# Patient Record
Sex: Male | Born: 1995 | Race: Black or African American | Hispanic: No | Marital: Single | Smoking: Never smoker
Health system: Southern US, Community
[De-identification: ages and names within clinical notes are randomized; demographics above are authoritative.]

## PROBLEM LIST (undated history)

## (undated) DIAGNOSIS — I10 Essential (primary) hypertension: Secondary | ICD-10-CM

## (undated) DIAGNOSIS — E119 Type 2 diabetes mellitus without complications: Secondary | ICD-10-CM

## (undated) HISTORY — DX: Type 2 diabetes mellitus without complications: E11.9

## (undated) HISTORY — DX: Essential (primary) hypertension: I10

---
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
## 2017-06-17 IMAGING — CR DG LUMBAR SPINE COMPLETE 4+V
5 series · 5 of 5 positions shown · non-contrast
Comparison: None.

CLINICAL DATA: Low back pain after weight lifting.

EXAM:
LUMBAR SPINE - COMPLETE 4+ VIEW

[l-spine ap]
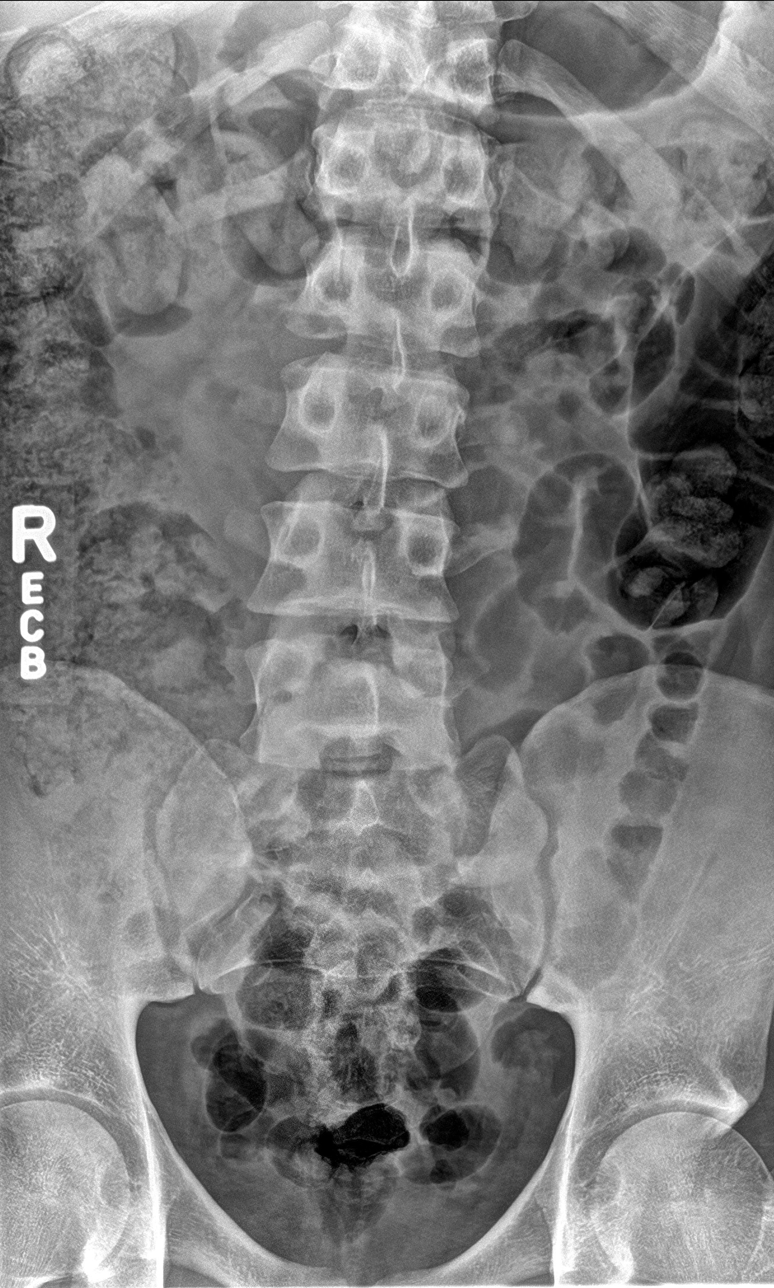

[l-spine obl (1 of 2)]
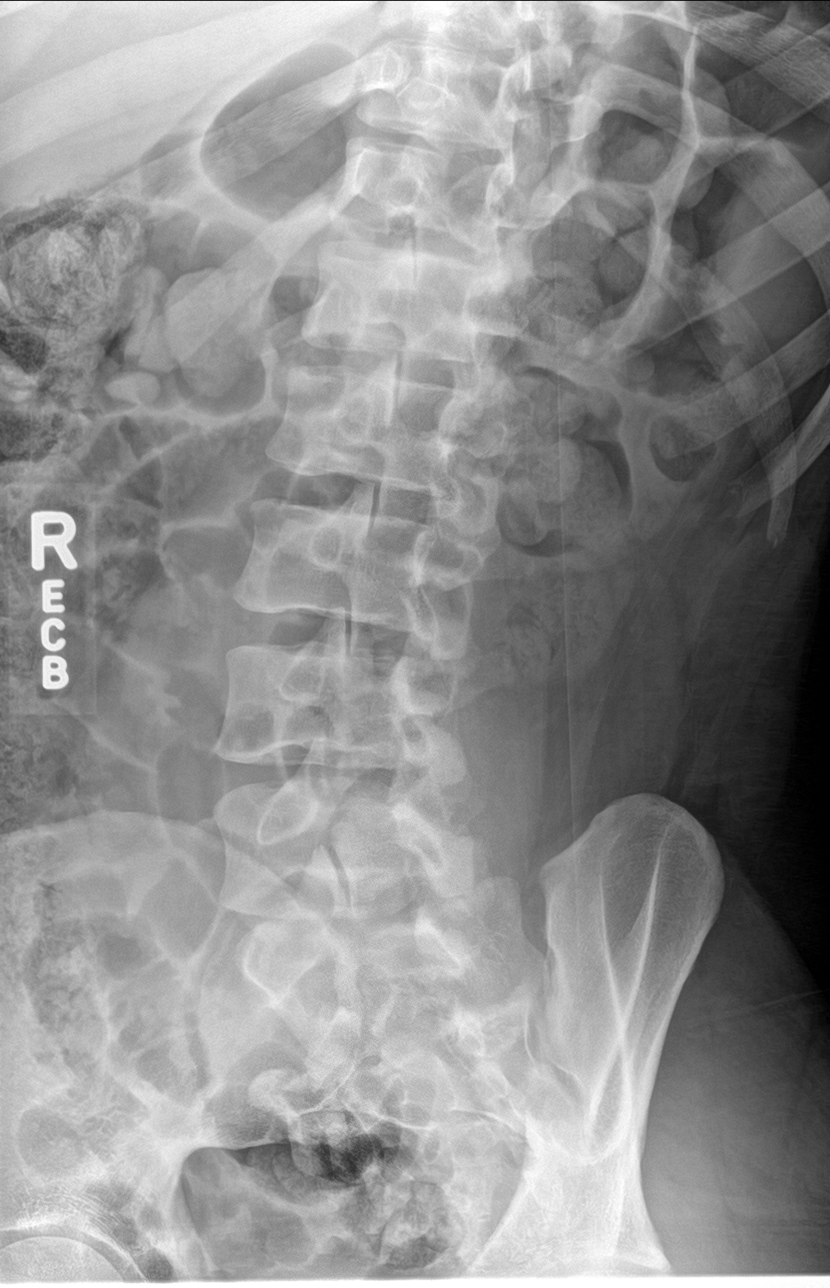

[l-spine obl (2 of 2)]
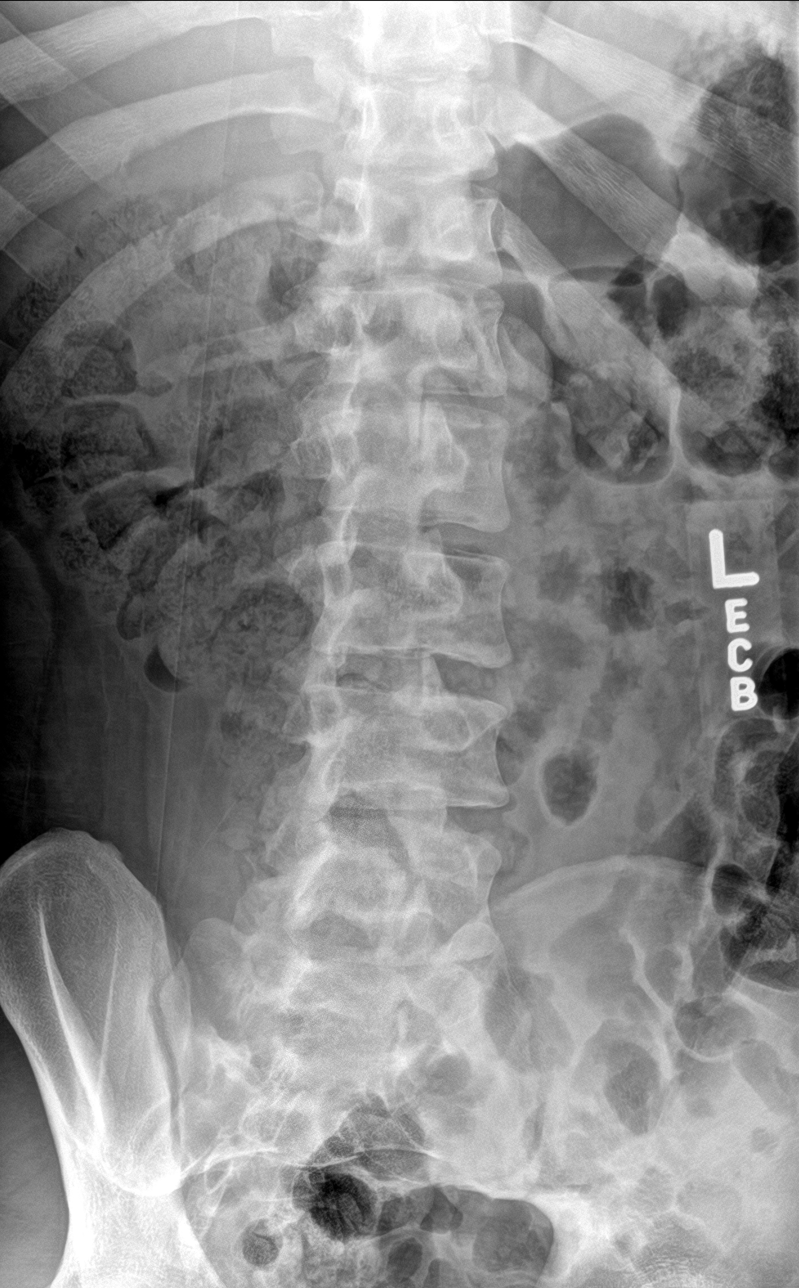

[l-spine lat]
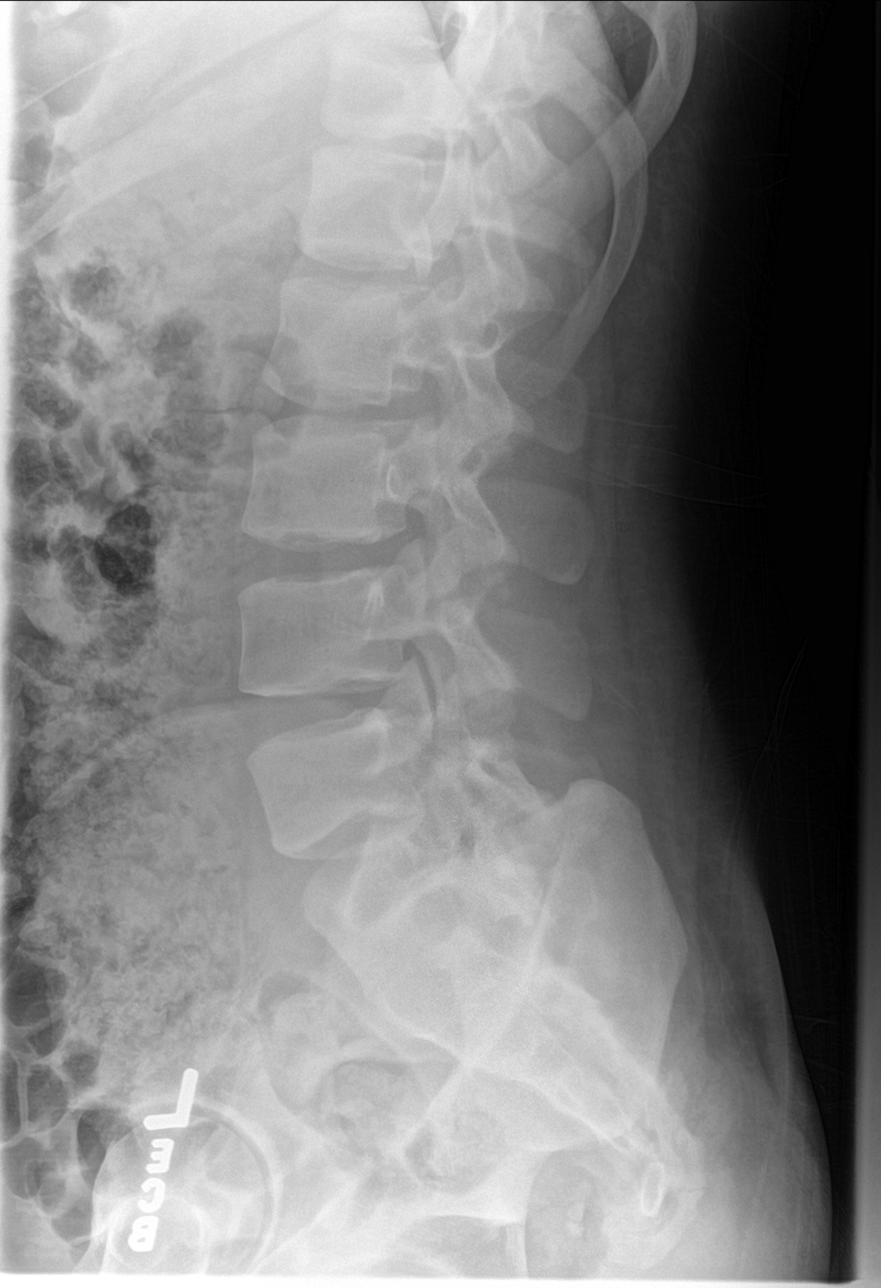

[l-spine spot]
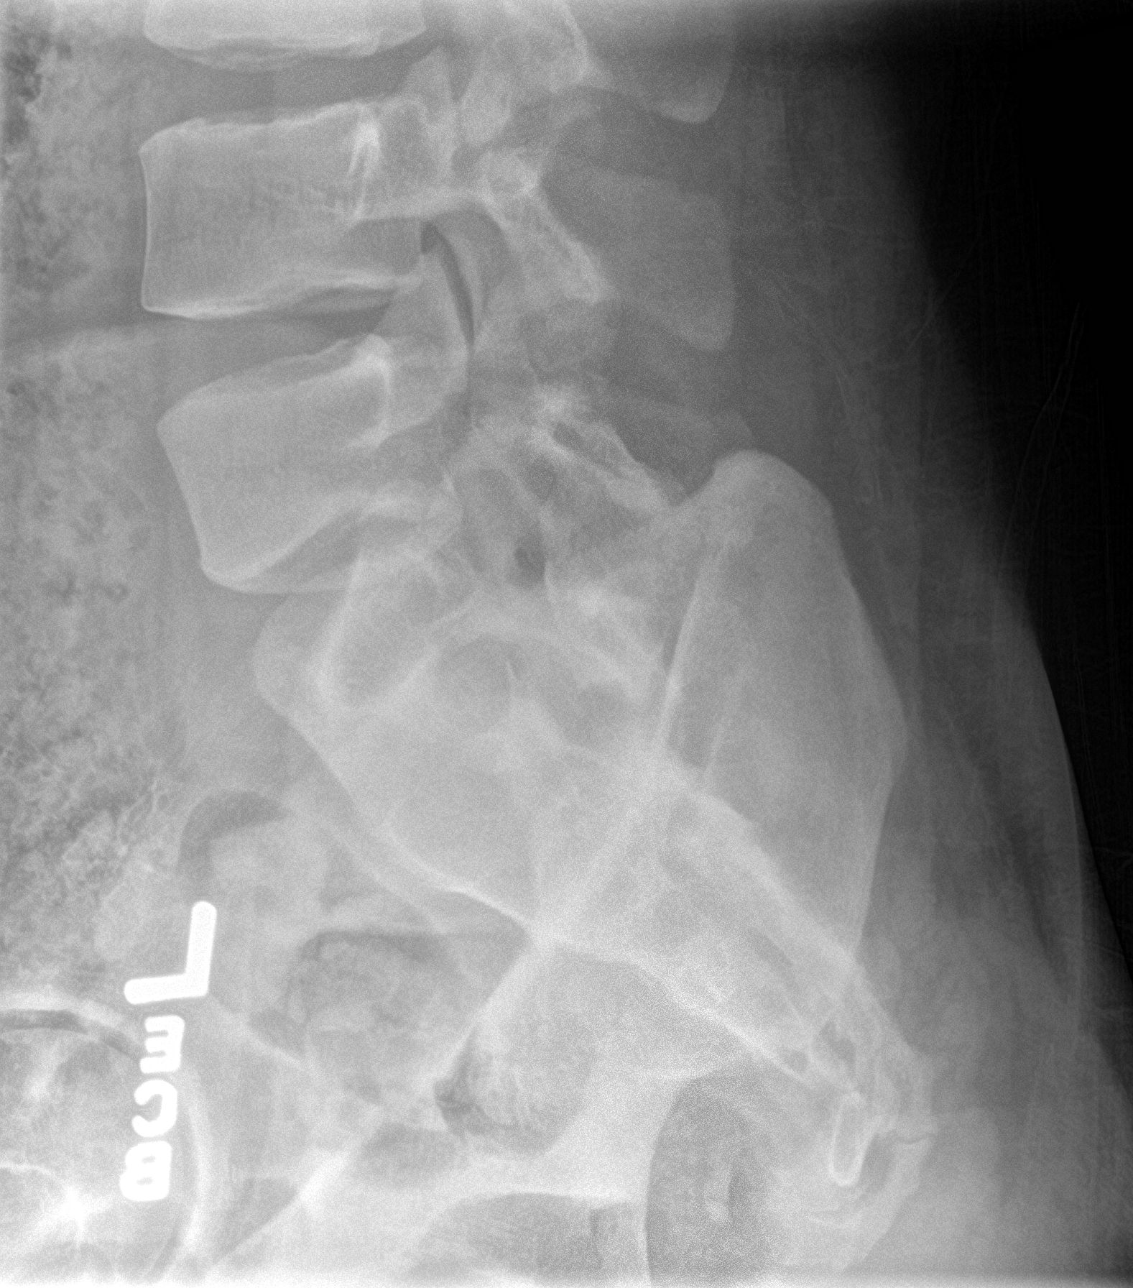

[5 of 5 positions shown; findings below may reference images not displayed]

FINDINGS: There is evidence of spondylolysis with bilateral pars defects at
the lowest segmental level of the lumbar spine. Exact vertebral body
numbering is somewhat difficult based on the lumbar spine films due
to would appears to be a transitional L5 level. On lateral views,
there is no evidence of associated listhesis secondary to
spondylolysis. No bony lesions are identified. The visualized sacrum
and sacroiliac joints appear normal.
IMPRESSION: Evidence of bilateral spondylolysis with bilateral pars defects
visualized at the lower most segmental level of the lumbar spine.
Exact numbering of this level may be either L4 or L5 based on what
appears to be transitional anatomy at the L5 level. This does not
represent an acute injury but can be a cause of low back pain.

## 2017-12-01 DIAGNOSIS — Z23 Encounter for immunization: Secondary | ICD-10-CM | POA: Diagnosis not present

## 2018-03-10 DIAGNOSIS — I10 Essential (primary) hypertension: Secondary | ICD-10-CM | POA: Diagnosis not present

## 2018-03-25 ENCOUNTER — Encounter (HOSPITAL_COMMUNITY): Payer: Self-pay | Admitting: Emergency Medicine

## 2018-03-25 ENCOUNTER — Inpatient Hospital Stay (HOSPITAL_COMMUNITY)
Admission: EM | Admit: 2018-03-25 | Discharge: 2018-03-28 | DRG: 638 | Disposition: A | Discharge status: Home / Self Care | Payer: 59 | Attending: Internal Medicine | Admitting: Internal Medicine

## 2018-03-25 ENCOUNTER — Other Ambulatory Visit: Payer: Self-pay

## 2018-03-25 ENCOUNTER — Inpatient Hospital Stay (HOSPITAL_COMMUNITY): Payer: 59

## 2018-03-25 DIAGNOSIS — Z23 Encounter for immunization: Secondary | ICD-10-CM

## 2018-03-25 DIAGNOSIS — E1010 Type 1 diabetes mellitus with ketoacidosis without coma: Principal | ICD-10-CM | POA: Diagnosis present

## 2018-03-25 DIAGNOSIS — Z833 Family history of diabetes mellitus: Secondary | ICD-10-CM

## 2018-03-25 DIAGNOSIS — D72829 Elevated white blood cell count, unspecified: Secondary | ICD-10-CM | POA: Diagnosis not present

## 2018-03-25 DIAGNOSIS — R531 Weakness: Secondary | ICD-10-CM | POA: Diagnosis not present

## 2018-03-25 DIAGNOSIS — E1110 Type 2 diabetes mellitus with ketoacidosis without coma: Secondary | ICD-10-CM | POA: Diagnosis not present

## 2018-03-25 DIAGNOSIS — E669 Obesity, unspecified: Secondary | ICD-10-CM | POA: Diagnosis not present

## 2018-03-25 DIAGNOSIS — E860 Dehydration: Secondary | ICD-10-CM | POA: Diagnosis not present

## 2018-03-25 DIAGNOSIS — E875 Hyperkalemia: Secondary | ICD-10-CM | POA: Diagnosis not present

## 2018-03-25 DIAGNOSIS — I4581 Long QT syndrome: Secondary | ICD-10-CM | POA: Diagnosis not present

## 2018-03-25 DIAGNOSIS — E876 Hypokalemia: Secondary | ICD-10-CM | POA: Diagnosis not present

## 2018-03-25 DIAGNOSIS — E1011 Type 1 diabetes mellitus with ketoacidosis with coma: Secondary | ICD-10-CM | POA: Diagnosis not present

## 2018-03-25 DIAGNOSIS — Z6835 Body mass index (BMI) 35.0-35.9, adult: Secondary | ICD-10-CM

## 2018-03-25 DIAGNOSIS — N179 Acute kidney failure, unspecified: Secondary | ICD-10-CM

## 2018-03-25 DIAGNOSIS — R112 Nausea with vomiting, unspecified: Secondary | ICD-10-CM | POA: Diagnosis present

## 2018-03-25 DIAGNOSIS — E1310 Other specified diabetes mellitus with ketoacidosis without coma: Secondary | ICD-10-CM

## 2018-03-25 LAB — COMPREHENSIVE METABOLIC PANEL
ALBUMIN: 4.4 g/dL (ref 3.5–5.0)
ALK PHOS: 83 U/L (ref 38–126)
ALT: 36 U/L (ref 0–44)
AST: 24 U/L (ref 15–41)
BUN: 44 mg/dL — ABNORMAL HIGH (ref 6–20)
CALCIUM: 8.9 mg/dL (ref 8.9–10.3)
Chloride: 78 mmol/L — ABNORMAL LOW (ref 98–111)
Creatinine, Ser: 4.64 mg/dL — ABNORMAL HIGH (ref 0.61–1.24)
GFR calc Af Amer: 20 mL/min — ABNORMAL LOW (ref >60)
GFR calc non Af Amer: 17 mL/min — ABNORMAL LOW (ref >60)
Glucose, Bld: 1134 mg/dL (ref 70–99)
POTASSIUM: 7.3 mmol/L — AB (ref 3.5–5.1)
SODIUM: 120 mmol/L — AB (ref 135–145)
Total Bilirubin: 1.9 mg/dL — ABNORMAL HIGH (ref 0.3–1.2)
Total Protein: 8.2 g/dL — ABNORMAL HIGH (ref 6.5–8.1)

## 2018-03-25 LAB — URINALYSIS, ROUTINE W REFLEX MICROSCOPIC
Bilirubin Urine: NEGATIVE
Glucose, UA: >=500 mg/dL — AB
Ketones, ur: 80 mg/dL — AB
Leukocytes, UA: NEGATIVE
Nitrite: NEGATIVE
Protein, ur: 30 mg/dL — AB
Specific Gravity, Urine: 1.02 (ref 1.005–1.030)
pH: 5 (ref 5.0–8.0)

## 2018-03-25 LAB — POCT I-STAT EG7
ACID-BASE DEFICIT: 23 mmol/L — AB (ref 0.0–2.0)
BICARBONATE: 5.6 mmol/L — AB (ref 20.0–28.0)
Calcium, Ion: 0.98 mmol/L — ABNORMAL LOW (ref 1.15–1.40)
HEMATOCRIT: 54 % — AB (ref 39.0–52.0)
HEMOGLOBIN: 18.4 g/dL — AB (ref 13.0–17.0)
O2 Saturation: 89 %
POTASSIUM: 7 mmol/L — AB (ref 3.5–5.1)
SODIUM: 116 mmol/L — AB (ref 135–145)
TCO2: 6 mmol/L — ABNORMAL LOW (ref 22–32)
pCO2, Ven: 20.3 mmHg — ABNORMAL LOW (ref 44.0–60.0)
pH, Ven: 7.05 — CL (ref 7.250–7.430)
pO2, Ven: 78 mmHg — ABNORMAL HIGH (ref 32.0–45.0)

## 2018-03-25 LAB — RAPID URINE DRUG SCREEN, HOSP PERFORMED
Amphetamines: NOT DETECTED
Barbiturates: NOT DETECTED
Benzodiazepines: NOT DETECTED
Cocaine: NOT DETECTED
OPIATES: NOT DETECTED
Tetrahydrocannabinol: NOT DETECTED

## 2018-03-25 LAB — CBC WITH DIFFERENTIAL/PLATELET
Abs Immature Granulocytes: 0.21 10*3/uL — ABNORMAL HIGH (ref 0.00–0.07)
BASOS ABS: 0.1 10*3/uL (ref 0.0–0.1)
BASOS PCT: 0 %
EOS ABS: 0 10*3/uL (ref 0.0–0.5)
EOS PCT: 0 %
HCT: 53.1 % — ABNORMAL HIGH (ref 39.0–52.0)
Hemoglobin: 16.4 g/dL (ref 13.0–17.0)
Immature Granulocytes: 1 %
Lymphocytes Relative: 8 %
Lymphs Abs: 1.6 10*3/uL (ref 0.7–4.0)
MCH: 29.5 pg (ref 26.0–34.0)
MCHC: 30.9 g/dL (ref 30.0–36.0)
MCV: 95.7 fL (ref 80.0–100.0)
MONO ABS: 1.3 10*3/uL — AB (ref 0.1–1.0)
Monocytes Relative: 7 %
NRBC: 0 % (ref 0.0–0.2)
Neutro Abs: 16 10*3/uL — ABNORMAL HIGH (ref 1.7–7.7)
Neutrophils Relative %: 84 %
PLATELETS: 204 10*3/uL (ref 150–400)
RBC: 5.55 MIL/uL (ref 4.22–5.81)
RDW: 13.7 % (ref 11.5–15.5)
WBC: 19.1 10*3/uL — AB (ref 4.0–10.5)

## 2018-03-25 LAB — GLUCOSE, CAPILLARY
Glucose-Capillary: 202 mg/dL — ABNORMAL HIGH (ref 70–99)
Glucose-Capillary: 205 mg/dL — ABNORMAL HIGH (ref 70–99)
Glucose-Capillary: 211 mg/dL — ABNORMAL HIGH (ref 70–99)
Glucose-Capillary: 273 mg/dL — ABNORMAL HIGH (ref 70–99)
Glucose-Capillary: 344 mg/dL — ABNORMAL HIGH (ref 70–99)
Glucose-Capillary: 458 mg/dL — ABNORMAL HIGH (ref 70–99)
Glucose-Capillary: 520 mg/dL (ref 70–99)
Glucose-Capillary: 569 mg/dL (ref 70–99)
Glucose-Capillary: >600 mg/dL (ref 70–99)
Glucose-Capillary: >600 mg/dL (ref 70–99)

## 2018-03-25 LAB — BASIC METABOLIC PANEL
Anion gap: 25 — ABNORMAL HIGH (ref 5–15)
Anion gap: 22 — ABNORMAL HIGH (ref 5–15)
Anion gap: 18 — ABNORMAL HIGH (ref 5–15)
BUN: 49 mg/dL — ABNORMAL HIGH (ref 6–20)
BUN: 51 mg/dL — ABNORMAL HIGH (ref 6–20)
BUN: 46 mg/dL — ABNORMAL HIGH (ref 6–20)
BUN: 41 mg/dL — ABNORMAL HIGH (ref 6–20)
CHLORIDE: 96 mmol/L — AB (ref 98–111)
CO2: <7 mmol/L — ABNORMAL LOW (ref 22–32)
CO2: 8 mmol/L — AB (ref 22–32)
CO2: 12 mmol/L — ABNORMAL LOW (ref 22–32)
CO2: 16 mmol/L — ABNORMAL LOW (ref 22–32)
CREATININE: 2.16 mg/dL — AB (ref 0.61–1.24)
Calcium: 8.5 mg/dL — ABNORMAL LOW (ref 8.9–10.3)
Calcium: 8.4 mg/dL — ABNORMAL LOW (ref 8.9–10.3)
Calcium: 9.1 mg/dL (ref 8.9–10.3)
Calcium: 8.9 mg/dL (ref 8.9–10.3)
Chloride: 89 mmol/L — ABNORMAL LOW (ref 98–111)
Chloride: 102 mmol/L (ref 98–111)
Chloride: 102 mmol/L (ref 98–111)
Creatinine, Ser: 3.7 mg/dL — ABNORMAL HIGH (ref 0.61–1.24)
Creatinine, Ser: 3.68 mg/dL — ABNORMAL HIGH (ref 0.61–1.24)
Creatinine, Ser: 2.97 mg/dL — ABNORMAL HIGH (ref 0.61–1.24)
GFR calc Af Amer: 26 mL/min — ABNORMAL LOW (ref >60)
GFR calc Af Amer: 26 mL/min — ABNORMAL LOW (ref >60)
GFR calc Af Amer: 34 mL/min — ABNORMAL LOW (ref >60)
GFR calc Af Amer: 49 mL/min — ABNORMAL LOW (ref >60)
GFR calc non Af Amer: 22 mL/min — ABNORMAL LOW (ref >60)
GFR calc non Af Amer: 22 mL/min — ABNORMAL LOW (ref >60)
GFR calc non Af Amer: 29 mL/min — ABNORMAL LOW (ref >60)
GFR calc non Af Amer: 43 mL/min — ABNORMAL LOW (ref >60)
Glucose, Bld: 904 mg/dL (ref 70–99)
Glucose, Bld: 720 mg/dL (ref 70–99)
Glucose, Bld: 401 mg/dL — ABNORMAL HIGH (ref 70–99)
Glucose, Bld: 225 mg/dL — ABNORMAL HIGH (ref 70–99)
Potassium: 7.2 mmol/L (ref 3.5–5.1)
Potassium: 5.3 mmol/L — ABNORMAL HIGH (ref 3.5–5.1)
Potassium: 4.7 mmol/L (ref 3.5–5.1)
Potassium: 3.9 mmol/L (ref 3.5–5.1)
Sodium: 126 mmol/L — ABNORMAL LOW (ref 135–145)
Sodium: 129 mmol/L — ABNORMAL LOW (ref 135–145)
Sodium: 136 mmol/L (ref 135–145)
Sodium: 136 mmol/L (ref 135–145)

## 2018-03-25 LAB — CBG MONITORING, ED
Glucose-Capillary: >600 mg/dL (ref 70–99)
Glucose-Capillary: >600 mg/dL (ref 70–99)
Glucose-Capillary: >600 mg/dL (ref 70–99)

## 2018-03-25 LAB — CBC
HCT: 50.1 % (ref 39.0–52.0)
Hemoglobin: 15.7 g/dL (ref 13.0–17.0)
MCH: 29.5 pg (ref 26.0–34.0)
MCHC: 31.3 g/dL (ref 30.0–36.0)
MCV: 94 fL (ref 80.0–100.0)
NRBC: 0 % (ref 0.0–0.2)
PLATELETS: 168 10*3/uL (ref 150–400)
RBC: 5.33 MIL/uL (ref 4.22–5.81)
RDW: 13.2 % (ref 11.5–15.5)
WBC: 19.8 10*3/uL — ABNORMAL HIGH (ref 4.0–10.5)

## 2018-03-25 LAB — LIPASE, BLOOD: Lipase: 1146 U/L — ABNORMAL HIGH (ref 11–51)

## 2018-03-25 LAB — MAGNESIUM: Magnesium: 2.9 mg/dL — ABNORMAL HIGH (ref 1.7–2.4)

## 2018-03-25 LAB — MRSA PCR SCREENING: MRSA BY PCR: NEGATIVE

## 2018-03-25 LAB — LACTIC ACID, PLASMA: Lactic Acid, Venous: 1.8 mmol/L (ref 0.5–1.9)

## 2018-03-25 LAB — AMYLASE: Amylase: 609 U/L — ABNORMAL HIGH (ref 28–100)

## 2018-03-25 LAB — PHOSPHORUS: Phosphorus: 8.3 mg/dL — ABNORMAL HIGH (ref 2.5–4.6)

## 2018-03-25 MED ORDER — INSULIN REGULAR(HUMAN) IN NACL 100-0.9 UT/100ML-% IV SOLN: INTRAVENOUS | Status: DC

## 2018-03-25 MED ORDER — SODIUM CHLORIDE 0.9 % IV BOLUS
2000.0000 mL | Freq: Once | INTRAVENOUS | Status: AC
  Administered 2018-03-25: 2000 mL via INTRAVENOUS

## 2018-03-25 MED ORDER — LACTATED RINGERS IV BOLUS
1000.0000 mL | Freq: Once | INTRAVENOUS | Status: AC
  Administered 2018-03-25: 1000 mL via INTRAVENOUS

## 2018-03-25 MED ORDER — DEXTROSE-NACL 5-0.45 % IV SOLN
INTRAVENOUS | Status: DC
  Administered 2018-03-26: 14:00:00 via INTRAVENOUS

## 2018-03-25 MED ORDER — ONDANSETRON HCL 4 MG/2ML IJ SOLN
4.0000 mg | Freq: Once | INTRAMUSCULAR | Status: AC
  Administered 2018-03-25: 4 mg via INTRAVENOUS
  Filled 2018-03-25: qty 2

## 2018-03-25 MED ORDER — PNEUMOCOCCAL VAC POLYVALENT 25 MCG/0.5ML IJ INJ
0.5000 mL | INJECTION | INTRAMUSCULAR | Status: AC
  Administered 2018-03-27: 0.5 mL via INTRAMUSCULAR
  Filled 2018-03-25: qty 0.5

## 2018-03-25 MED ORDER — INSULIN REGULAR(HUMAN) IN NACL 100-0.9 UT/100ML-% IV SOLN
INTRAVENOUS | Status: DC
  Administered 2018-03-25: 5.4 [IU]/h via INTRAVENOUS
  Administered 2018-03-25: 11.4 [IU]/h via INTRAVENOUS
  Administered 2018-03-25: 23 [IU]/h via INTRAVENOUS
  Administered 2018-03-26: 8 [IU]/h via INTRAVENOUS
  Administered 2018-03-27: 8.4 [IU]/h via INTRAVENOUS
  Filled 2018-03-25 (×6): qty 100

## 2018-03-25 MED ORDER — CALCIUM GLUCONATE-NACL 1-0.675 GM/50ML-% IV SOLN
INTRAVENOUS | Status: AC
  Filled 2018-03-25: qty 50

## 2018-03-25 MED ORDER — SODIUM CHLORIDE 0.9 % IV BOLUS
1000.0000 mL | Freq: Once | INTRAVENOUS | Status: AC
  Administered 2018-03-25: 1000 mL via INTRAVENOUS

## 2018-03-25 MED ORDER — DEXTROSE-NACL 5-0.45 % IV SOLN
INTRAVENOUS | Status: DC
  Administered 2018-03-25 – 2018-03-26 (×2): via INTRAVENOUS

## 2018-03-25 MED ORDER — ENOXAPARIN SODIUM 40 MG/0.4ML ~~LOC~~ SOLN
40.0000 mg | SUBCUTANEOUS | Status: DC
  Administered 2018-03-25 – 2018-03-26 (×2): 40 mg via SUBCUTANEOUS
  Filled 2018-03-25 (×3): qty 0.4

## 2018-03-25 MED ORDER — SODIUM CHLORIDE 0.9 % IV SOLN
INTRAVENOUS | Status: DC
  Administered 2018-03-25: 13:00:00 via INTRAVENOUS

## 2018-03-25 MED ORDER — HEPARIN SODIUM (PORCINE) 5000 UNIT/ML IJ SOLN: 5000.0000 [IU] | Freq: Three times a day (TID) | INTRAMUSCULAR | Status: DC

## 2018-03-25 MED ORDER — SODIUM CHLORIDE 0.9 % IV SOLN
INTRAVENOUS | Status: DC
  Administered 2018-03-25: 09:00:00 via INTRAVENOUS

## 2018-03-25 MED ORDER — SODIUM CHLORIDE 0.9 % IV SOLN
1.0000 g | Freq: Once | INTRAVENOUS | Status: AC
  Administered 2018-03-25: 1 g via INTRAVENOUS
  Filled 2018-03-25: qty 10

## 2018-03-25 MED ORDER — SODIUM CHLORIDE 0.9 % IV SOLN: INTRAVENOUS | Status: AC

## 2018-03-25 NOTE — ED Notes (Signed)
ED Provider at bedside. 

## 2018-03-25 NOTE — ED Notes (Signed)
Pharmacy contacted to tube down calcium, advised they just finished mixing it and would send it asap

## 2018-03-25 NOTE — ED Triage Notes (Signed)
Pt. Stated, Im really weak and Ive not eaten in a week.

## 2018-03-25 NOTE — ED Provider Notes (Signed)
[facility] Provider Note   CSN: [id number] Arrival date & time: 03/25/18  1443     History   Chief Complaint Chief Complaint  Patient presents with  . Weakness  . Emesis  . Nausea    HPI [facility] is a 23 y.o. male.  The history is provided by the patient and a parent.  Weakness  Severity:  Severe Onset quality:  Gradual Duration: 1-2 weeks of just not feeling well, severe sx in the last 1-2 days. Timing:  Constant Progression:  Worsening Chronicity:  New Context: not dehydration, not increased activity and not urinary tract infection   Context comment:  Patient states he was seen 2 weeks ago at urgent care and his blood pressure was elevated.  States at that time he just generally was not feeling well. Relieved by:  Nothing Worsened by:  Nothing Ineffective treatments:  None tried Associated symptoms: frequency, nausea and vomiting   Associated symptoms: no abdominal pain, no chest pain, no cough, no diarrhea, no dysuria, no headaches and no shortness of breath   Associated symptoms comment:  Polyuria and polydipsia.  Mom states just not himself in the last 24 hours and being a little bit off Risk factors comment:  No known medical problems except for 2 weeks ago when he was in urgent care he was diagnosed with high blood pressure but just took 1 pill yesterday for the first time Emesis  Associated symptoms: no abdominal pain, no cough, no diarrhea and no headaches     History reviewed. No pertinent past medical history.  Patient Active Problem List   Diagnosis Date Noted  . Sports physical 09/14/2011    History reviewed. No pertinent surgical history.      Home Medications    Prior to Admission medications   Medication Sig Start Date End Date Taking? Authorizing Provider  diclofenac (VOLTAREN) 75 MG EC tablet Take 1 tablet (75 mg total) by mouth 2 (two) times daily. 11/19/14   Ozella Rocks, MD    HYDROcodone-acetaminophen (NORCO/VICODIN) 5-325 MG tablet Take 1-2 tabs PO Q6h prn pain not relieved by Tylenol 11/23/14   Lowanda Foster, NP  methocarbamol (ROBAXIN) 500 MG tablet Take 1-2 tablets (500-1,000 mg total) by mouth every 6 (six) hours as needed for muscle spasms. 11/19/14   Ozella Rocks, MD    Family History Family History  Problem Relation Age of Onset  . Cancer Other   . Diabetes Other   . Sudden death Neg Hx   . Heart attack Neg Hx     Social History Social History   Tobacco Use  . Smoking status: Never Smoker  Substance Use Topics  . Alcohol use: Not on file  . Drug use: Not on file     Allergies   Patient has no known allergies.   Review of Systems Review of Systems  Respiratory: Negative for cough and shortness of breath.   Cardiovascular: Negative for chest pain.  Gastrointestinal: Positive for nausea and vomiting. Negative for abdominal pain and diarrhea.  Genitourinary: Positive for frequency. Negative for dysuria.  Neurological: Positive for weakness. Negative for headaches.  All other systems reviewed and are negative.    Physical Exam Updated Vital Signs BP (!) 114/58 (BP Location: Right Arm)   Pulse (!) 117   Temp 97.6 F (36.4 C) (Oral)   SpO2 100%   Physical Exam Vitals signs and nursing note reviewed.  Constitutional:      General: He  is not in acute distress.    Appearance: He is well-developed. He is obese. He is ill-appearing.     Comments: Smells of ketones  HENT:     Head: Normocephalic and atraumatic.     Mouth/Throat:     Mouth: Mucous membranes are dry.  Eyes:     Conjunctiva/sclera: Conjunctivae normal.     Pupils: Pupils are equal, round, and reactive to light.  Neck:     Musculoskeletal: Normal range of motion and neck supple.  Cardiovascular:     Rate and Rhythm: Regular rhythm. Tachycardia present.     Heart sounds: No murmur.  Pulmonary:     Effort: Pulmonary effort is normal. Tachypnea present. No  respiratory distress.     Breath sounds: Normal breath sounds. No wheezing or rales.  Abdominal:     General: There is no distension.     Palpations: Abdomen is soft.     Tenderness: There is no abdominal tenderness. There is no guarding or rebound.  Musculoskeletal: Normal range of motion.        General: No tenderness.  Skin:    General: Skin is warm and dry.     Findings: No erythema or rash.  Neurological:     Mental Status: He is alert and oriented to person, place, and time.     Comments: sluggish      ED Treatments / Results  Labs (all labs ordered are listed, but only abnormal results are displayed) Labs Reviewed  CBC WITH DIFFERENTIAL/PLATELET - Abnormal; Notable for the following components:      Result Value   WBC 19.1 (*)    HCT 53.1 (*)    Neutro Abs 16.0 (*)    Monocytes Absolute 1.3 (*)    Abs Immature Granulocytes 0.21 (*)    All other components within normal limits  COMPREHENSIVE METABOLIC PANEL - Abnormal; Notable for the following components:   Sodium 120 (*)    Potassium 7.3 (*)    Chloride 78 (*)    CO2 <7 (*)    Glucose, Bld 1,134 (*)    BUN 44 (*)    Creatinine, Ser 4.64 (*)    Total Protein 8.2 (*)    Total Bilirubin 1.9 (*)    GFR calc non Af Amer 17 (*)    GFR calc Af Amer 20 (*)    All other components within normal limits  CBG MONITORING, ED - Abnormal; Notable for the following components:   Glucose-Capillary >600 (*)    All other components within normal limits  POCT I-STAT EG7 - Abnormal; Notable for the following components:   pH, Ven 7.050 (*)    pCO2, Ven 20.3 (*)    pO2, Ven 78.0 (*)    Bicarbonate 5.6 (*)    TCO2 6 (*)    Acid-base deficit 23.0 (*)    Sodium 116 (*)    Potassium 7.0 (*)    Calcium, Ion 0.98 (*)    HCT 54.0 (*)    Hemoglobin 18.4 (*)    All other components within normal limits    EKG EKG Interpretation  Date/Time:  Sunday March 25 2018 07:38:08 EST Ventricular Rate:  97 PR Interval:    QRS  Duration: 130 QT Interval:  371 QTC Calculation: 472 R Axis:   31 Text Interpretation:  Sinus rhythm Nonspecific intraventricular conduction delay ST elev, probable normal early repol pattern peaked t-waves No previous tracing Confirmed by Gwyneth SproutPlunkett, Bessy Reaney ([id number]) on 03/25/2018 7:41:41 AM   Radiology  No results found.  Procedures Procedures (including critical care time)  Medications Ordered in ED Medications  calcium gluconate 1 g in sodium chloride 0.9 % 100 mL IVPB ( Intravenous Rate/Dose Verify 03/25/18 0809)  dextrose 5 %-0.45 % sodium chloride infusion (has no administration in time range)  insulin regular, human (MYXREDLIN) 100 units/ 100 mL infusion (has no administration in time range)  sodium chloride 0.9 % bolus 1,000 mL (has no administration in time range)    And  0.9 %  sodium chloride infusion (has no administration in time range)  ondansetron (ZOFRAN) injection 4 mg (has no administration in time range)  sodium chloride 0.9 % bolus 2,000 mL (2,000 mLs Intravenous New Bag/Given 03/25/18 0737)     Initial Impression / Assessment and Plan / ED Course  I have reviewed the triage vital signs and the nursing notes.  Pertinent labs & imaging results that were available during my care of the patient were reviewed by me and considered in my medical decision making (see chart for details).     Presenting today with several weeks of not feeling well but within the last 24 hours having vomiting, generalized weakness and lethargy.  Upon exam patient smells of ketones and has a blood sugar of greater than 600.  Suspect DKA.  He denies any cough, congestion, shortness of breath, abdominal pain.  Low suspicion for UTI.  No prior history of diabetes but does have a family history.  Mom also states he is gained a significant amount of weight in the last few years.  Patient was told he had hypertension 2 weeks ago at urgent care but never followed up to get the blood work done.  Took 1  tablet yesterday because he was not feeling well.  Blood pressure today is 109/50 to 115/58.  He is tachycardic but responsive.  CBC, CMP, EG 7 pending.  Patient given 2 L of IV fluids.  EKG with some peaked t-waves and concern for possible hyperkalemia.  7:51 AM Pt's potassium is 7, pH is 7.05 and bicarb is 5.  Patient sodium is also 116 which is most likely related to blood sugar.  Given patient's peaked T waves on his EKG we will treat with calcium gluconate.  Fluids are continuing.  We will plan on starting an insulin drip this shows soon as the blood sugar returns and he gets his first 2 L of fluid.  9:05 AM MP returned with a sodium of 120, potassium was 7.3, blood sugar of 1134 and an anion gap of 36.  Patient is also in acute renal failure with a creatinine of 4.6.  Will get 1/3 L and insulin drip was started.  Patient was given Zofran for his vomiting.  He will be admitted for further care.  CRITICAL CARE Performed by: Biran Mayberry Total critical care time: 40 minutes Critical care time was exclusive of separately billable procedures and treating other patients. Critical care was necessary to treat or prevent imminent or life-threatening deterioration. Critical care was time spent personally by me on the following activities: development of treatment plan with patient and/or surrogate as well as nursing, discussions with consultants, evaluation of patient's response to treatment, examination of patient, obtaining history from patient or surrogate, ordering and performing treatments and interventions, ordering and review of laboratory studies, ordering and review of radiographic studies, pulse oximetry and re-evaluation of patient's condition.   Final Clinical Impressions(s) / ED Diagnoses   Final diagnoses:  Diabetic ketoacidosis without coma associated with  type 2 diabetes mellitus (HCC)  Hyperkalemia  Acute renal failure, unspecified acute renal failure type [facility])    ED  Discharge Orders    None       Gwyneth Sprout, MD 03/25/18 [phone]

## 2018-03-25 NOTE — H&P (Addendum)
NAME:  [facility], MRN:  [id number], DOB:  1996-01-10, LOS: 0 ADMISSION DATE:  03/25/2018, CONSULTATION DATE:  03/25/2018 REFERRING MD:  Salley ScarletW Plunkett MD, CHIEF COMPLAINT:  DKA  Brief History   23 year old with no significant past medical history admitted with nausea, vomiting, weakness, polyuria, polydipsia. Found to be in new DKA with AKI, hyperkalemia, severe acidosis.  PCCM consulted for admission  Past Medical History  No past medical history.  No diagnosis of diabetes as an outpatient  Significant Hospital Events   2/9> admitted with DKA  Consults:  PCCM  Procedures:    Significant Diagnostic Tests:  EKG 2/9-peaked T waves   Micro Data:    Antimicrobials:    Interim history/subjective:    Objective   Blood pressure (!) 140/59, pulse (!) 112, temperature (!) 97.4 F (36.3 C), temperature source Oral, resp. rate 16, SpO2 100 %.        Intake/Output Summary (Last 24 hours) at 03/25/2018 1008 Last data filed at 03/25/2018 1005 Gross per 24 hour  Intake 2.85 ml  Output 700 ml  Net -697.15 ml   There were no vitals filed for this visit.  Examination: Blood pressure (!) 140/59, pulse (!) 112, temperature (!) 97.4 F (36.3 C), temperature source Oral, resp. rate 16, SpO2 100 %. Gen:      Obese, moderate distress HEENT:  EOMI, sclera anicteric Neck:     No masses; no thyromegaly Lungs:    Tachypnea CV:         Tachycardia Abd:      + bowel sounds; soft, non-tender; no palpable masses, no distension Ext:    No edema; adequate peripheral perfusion Skin:      Warm and dry; no rash Neuro: alert and oriented x 3 Psych: normal mood and affect  Resolved Hospital Problem list     Assessment & Plan:  New onset diabetes presenting with DKA Admit to ICU Start insulin drip per DKA protocol  Acute kidney injury, hyperkalemia Follow BUN and creatinine Given calcium gluconate for potassium of 7.3.  Expect this will correct rapidly Check EKG, follow  labs  Leukocytosis Likely stress response Follow cultures.  Observe off antibiotics. Get chest x-ray  Best practice:  Diet: N.p.o. Pain/Anxiety/Delirium protocol (if indicated): NA VAP protocol (if indicated): NA DVT prophylaxis: Lovenox GI prophylaxis: NA Glucose control: Insulin drip Mobility: Bed Code Status: Full Family Communication: Patient updated.  No family at bedside. Disposition: ICU  Labs   CBC: Recent Labs  Lab 03/25/18 0738 03/25/18 0747  WBC 19.1*  --   NEUTROABS 16.0*  --   HGB 16.4 18.4*  HCT 53.1* 54.0*  MCV 95.7  --   PLT 204  --     Basic Metabolic Panel: Recent Labs  Lab 03/25/18 0738 03/25/18 0747  NA 120* 116*  K 7.3* 7.0*  CL 78*  --   CO2 <7*  --   GLUCOSE 1,134*  --   BUN 44*  --   CREATININE 4.64*  --   CALCIUM 8.9  --    GFR: CrCl cannot be calculated (Unknown ideal weight.). Recent Labs  Lab 03/25/18 0738  WBC 19.1*    Liver Function Tests: Recent Labs  Lab 03/25/18 0738  AST 24  ALT 36  ALKPHOS 83  BILITOT 1.9*  PROT 8.2*  ALBUMIN 4.4   No results for input(s): LIPASE, AMYLASE in the last 168 hours. No results for input(s): AMMONIA in the last 168 hours.  ABG    Component  Value Date/Time   HCO3 5.6 (L) 03/25/2018 0747   TCO2 6 (L) 03/25/2018 0747   ACIDBASEDEF 23.0 (H) 03/25/2018 0747   O2SAT 89.0 03/25/2018 0747     Coagulation Profile: No results for input(s): INR, PROTIME in the last 168 hours.  Cardiac Enzymes: No results for input(s): CKTOTAL, CKMB, CKMBINDEX, TROPONINI in the last 168 hours.  HbA1C: No results found for: HGBA1C  CBG: Recent Labs  Lab 03/25/18 0728 03/25/18 0920  GLUCAP >600* >600*    Review of Systems:    All negative; except for those that are bolded, which indicate positives.  Constitutional: weight loss, weight gain, night sweats, fevers, chills, fatigue, weakness.  HEENT: headaches, sore throat, sneezing, nasal congestion, post nasal drip, difficulty swallowing,  tooth/dental problems, visual complaints, visual changes, ear aches. Neuro: difficulty with speech, weakness, numbness, ataxia. CV:  chest pain, orthopnea, PND, swelling in lower extremities, dizziness, palpitations, syncope.  Resp: cough, hemoptysis, dyspnea, wheezing. GI: heartburn, indigestion, abdominal pain, nausea, vomiting, diarrhea, constipation, change in bowel habits, loss of appetite, hematemesis, melena, hematochezia.  GU: dysuria, change in color of urine, urgency or frequency, flank pain, hematuria. MSK: joint pain or swelling, decreased range of motion. Psych: change in mood or affect, depression, anxiety, suicidal ideations, homicidal ideations. Skin: rash, itching, bruising.  Past Medical History  He,  has no past medical history on file.   Surgical History   History reviewed. No pertinent surgical history.   Social History   reports that he has never smoked. He does not have any smokeless tobacco history on file.   Family History   His family history includes Cancer in an other family member; Diabetes in an other family member. There is no history of Sudden death or Heart attack.   Allergies No Known Allergies   Home Medications  Prior to Admission medications   Medication Sig Start Date End Date Taking? Authorizing Provider  diclofenac (VOLTAREN) 75 MG EC tablet Take 1 tablet (75 mg total) by mouth 2 (two) times daily. 11/19/14   Ozella Rocks, MD  HYDROcodone-acetaminophen (NORCO/VICODIN) 5-325 MG tablet Take 1-2 tabs PO Q6h prn pain not relieved by Tylenol 11/23/14   Lowanda Foster, NP  methocarbamol (ROBAXIN) 500 MG tablet Take 1-2 tablets (500-1,000 mg total) by mouth every 6 (six) hours as needed for muscle spasms. 11/19/14   Ozella Rocks, MD    The patient is critically ill with multiple organ system failure and requires high complexity decision making for assessment and support, frequent evaluation and titration of therapies, advanced monitoring, review of  radiographic studies and interpretation of complex data.   Critical Care Time devoted to patient care services, exclusive of separately billable procedures, described in this note is 35 minutes.   Chilton Greathouse MD [facility] Pager [phone] If no answer call [phone] 03/25/2018, 10:28 AM

## 2018-03-25 NOTE — Progress Notes (Signed)
CRITICAL VALUE ALERT  Critical Value:  K+ 7.2, CO2 <7, Glucose 904  Date & Time Notied:  03/25/18 12:59 PM   Provider Notified: Dr. Isaiah Serge  Orders Received/Actions taken: Continue ordered treatment.

## 2018-03-26 DIAGNOSIS — E1011 Type 1 diabetes mellitus with ketoacidosis with coma: Secondary | ICD-10-CM

## 2018-03-26 LAB — GLUCOSE, CAPILLARY
GLUCOSE-CAPILLARY: 179 mg/dL — AB (ref 70–99)
GLUCOSE-CAPILLARY: 156 mg/dL — AB (ref 70–99)
Glucose-Capillary: 124 mg/dL — ABNORMAL HIGH (ref 70–99)
Glucose-Capillary: 131 mg/dL — ABNORMAL HIGH (ref 70–99)
Glucose-Capillary: 141 mg/dL — ABNORMAL HIGH (ref 70–99)
Glucose-Capillary: 143 mg/dL — ABNORMAL HIGH (ref 70–99)
Glucose-Capillary: 144 mg/dL — ABNORMAL HIGH (ref 70–99)
Glucose-Capillary: 147 mg/dL — ABNORMAL HIGH (ref 70–99)
Glucose-Capillary: 147 mg/dL — ABNORMAL HIGH (ref 70–99)
Glucose-Capillary: 147 mg/dL — ABNORMAL HIGH (ref 70–99)
Glucose-Capillary: 152 mg/dL — ABNORMAL HIGH (ref 70–99)
Glucose-Capillary: 154 mg/dL — ABNORMAL HIGH (ref 70–99)
Glucose-Capillary: 156 mg/dL — ABNORMAL HIGH (ref 70–99)
Glucose-Capillary: 159 mg/dL — ABNORMAL HIGH (ref 70–99)
Glucose-Capillary: 161 mg/dL — ABNORMAL HIGH (ref 70–99)
Glucose-Capillary: 169 mg/dL — ABNORMAL HIGH (ref 70–99)
Glucose-Capillary: 172 mg/dL — ABNORMAL HIGH (ref 70–99)
Glucose-Capillary: 181 mg/dL — ABNORMAL HIGH (ref 70–99)
Glucose-Capillary: 185 mg/dL — ABNORMAL HIGH (ref 70–99)

## 2018-03-26 LAB — PHOSPHORUS: Phosphorus: 1.9 mg/dL — ABNORMAL LOW (ref 2.5–4.6)

## 2018-03-26 LAB — BASIC METABOLIC PANEL
Anion gap: 19 — ABNORMAL HIGH (ref 5–15)
Anion gap: 18 — ABNORMAL HIGH (ref 5–15)
Anion gap: 16 — ABNORMAL HIGH (ref 5–15)
Anion gap: 11 (ref 5–15)
Anion gap: 16 — ABNORMAL HIGH (ref 5–15)
BUN: 36 mg/dL — AB (ref 6–20)
BUN: 27 mg/dL — AB (ref 6–20)
BUN: 22 mg/dL — ABNORMAL HIGH (ref 6–20)
BUN: 16 mg/dL (ref 6–20)
BUN: 16 mg/dL (ref 6–20)
CHLORIDE: 106 mmol/L (ref 98–111)
CO2: 15 mmol/L — ABNORMAL LOW (ref 22–32)
CO2: 17 mmol/L — ABNORMAL LOW (ref 22–32)
CO2: 15 mmol/L — ABNORMAL LOW (ref 22–32)
CO2: 16 mmol/L — ABNORMAL LOW (ref 22–32)
CO2: 19 mmol/L — ABNORMAL LOW (ref 22–32)
CREATININE: 1.73 mg/dL — AB (ref 0.61–1.24)
CREATININE: 0.99 mg/dL (ref 0.61–1.24)
Calcium: 9.3 mg/dL (ref 8.9–10.3)
Calcium: 9.3 mg/dL (ref 8.9–10.3)
Calcium: 9.3 mg/dL (ref 8.9–10.3)
Calcium: 6.9 mg/dL — ABNORMAL LOW (ref 8.9–10.3)
Calcium: 9.4 mg/dL (ref 8.9–10.3)
Chloride: 106 mmol/L (ref 98–111)
Chloride: 112 mmol/L — ABNORMAL HIGH (ref 98–111)
Chloride: 117 mmol/L — ABNORMAL HIGH (ref 98–111)
Chloride: 107 mmol/L (ref 98–111)
Creatinine, Ser: 1.54 mg/dL — ABNORMAL HIGH (ref 0.61–1.24)
Creatinine, Ser: 1.38 mg/dL — ABNORMAL HIGH (ref 0.61–1.24)
Creatinine, Ser: 1.31 mg/dL — ABNORMAL HIGH (ref 0.61–1.24)
GFR calc Af Amer: >60 mL/min (ref >60)
GFR calc Af Amer: >60 mL/min (ref >60)
GFR calc Af Amer: >60 mL/min (ref >60)
GFR calc Af Amer: >60 mL/min (ref >60)
GFR calc Af Amer: >60 mL/min (ref >60)
GFR calc non Af Amer: 56 mL/min — ABNORMAL LOW (ref >60)
GFR calc non Af Amer: >60 mL/min (ref >60)
GFR calc non Af Amer: >60 mL/min (ref >60)
GFR calc non Af Amer: >60 mL/min (ref >60)
GLUCOSE: 154 mg/dL — AB (ref 70–99)
Glucose, Bld: 154 mg/dL — ABNORMAL HIGH (ref 70–99)
Glucose, Bld: 171 mg/dL — ABNORMAL HIGH (ref 70–99)
Glucose, Bld: 126 mg/dL — ABNORMAL HIGH (ref 70–99)
Glucose, Bld: 159 mg/dL — ABNORMAL HIGH (ref 70–99)
Potassium: 4.1 mmol/L (ref 3.5–5.1)
Potassium: 3.7 mmol/L (ref 3.5–5.1)
Potassium: 4 mmol/L (ref 3.5–5.1)
Potassium: 2.9 mmol/L — ABNORMAL LOW (ref 3.5–5.1)
Potassium: 3.5 mmol/L (ref 3.5–5.1)
Sodium: 140 mmol/L (ref 135–145)
Sodium: 141 mmol/L (ref 135–145)
Sodium: 143 mmol/L (ref 135–145)
Sodium: 144 mmol/L (ref 135–145)
Sodium: 142 mmol/L (ref 135–145)

## 2018-03-26 LAB — CBC
HCT: 44.4 % (ref 39.0–52.0)
Hemoglobin: 15.6 g/dL (ref 13.0–17.0)
MCH: 29.9 pg (ref 26.0–34.0)
MCHC: 35.1 g/dL (ref 30.0–36.0)
MCV: 85.2 fL (ref 80.0–100.0)
Platelets: 136 10*3/uL — ABNORMAL LOW (ref 150–400)
RBC: 5.21 MIL/uL (ref 4.22–5.81)
RDW: 13 % (ref 11.5–15.5)
WBC: 12.7 10*3/uL — ABNORMAL HIGH (ref 4.0–10.5)
nRBC: 0 % (ref 0.0–0.2)

## 2018-03-26 LAB — MAGNESIUM: Magnesium: 2.7 mg/dL — ABNORMAL HIGH (ref 1.7–2.4)

## 2018-03-26 LAB — CK: Total CK: 625 U/L — ABNORMAL HIGH (ref 49–397)

## 2018-03-26 LAB — HIV ANTIBODY (ROUTINE TESTING W REFLEX): HIV Screen 4th Generation wRfx: NONREACTIVE

## 2018-03-26 LAB — LIPASE, BLOOD: Lipase: 38 U/L (ref 11–51)

## 2018-03-26 LAB — AMYLASE: Amylase: 88 U/L (ref 28–100)

## 2018-03-26 MED ORDER — POTASSIUM CHLORIDE 10 MEQ/100ML IV SOLN
10.0000 meq | INTRAVENOUS | Status: AC
  Administered 2018-03-26 (×4): 10 meq via INTRAVENOUS
  Filled 2018-03-26 (×4): qty 100

## 2018-03-26 MED ORDER — POTASSIUM PHOSPHATES 15 MMOLE/5ML IV SOLN
10.0000 mmol | Freq: Once | INTRAVENOUS | Status: AC
  Administered 2018-03-26: 10 mmol via INTRAVENOUS
  Filled 2018-03-26: qty 3.33

## 2018-03-26 MED ORDER — PANTOPRAZOLE SODIUM 40 MG IV SOLR
40.0000 mg | Freq: Every day | INTRAVENOUS | Status: DC
  Administered 2018-03-26: 40 mg via INTRAVENOUS
  Filled 2018-03-26: qty 40

## 2018-03-26 MED ORDER — ONDANSETRON HCL 4 MG/2ML IJ SOLN
INTRAMUSCULAR | Status: AC
  Filled 2018-03-26: qty 2

## 2018-03-26 MED ORDER — PROMETHAZINE HCL 25 MG/ML IJ SOLN
12.5000 mg | Freq: Three times a day (TID) | INTRAMUSCULAR | Status: DC | PRN
  Administered 2018-03-26: 12.5 mg via INTRAVENOUS
  Filled 2018-03-26: qty 1

## 2018-03-26 MED ORDER — LACTATED RINGERS IV BOLUS
2000.0000 mL | Freq: Once | INTRAVENOUS | Status: AC
  Administered 2018-03-26: 2000 mL via INTRAVENOUS

## 2018-03-26 NOTE — Progress Notes (Signed)
Inpatient Diabetes Program Recommendations  AACE/ADA: New Consensus Statement on Inpatient Glycemic Control (2015)  Target Ranges:  Prepandial:   less than 140 mg/dL      Peak postprandial:   less than 180 mg/dL (1-2 hours)      Critically ill patients:  140 - 180 mg/dL   Lab Results  Component Value Date   GLUCAP 179 (H) 03/26/2018    Review of Glycemic Control Results for [facility], Doyce W (MRN [id number]) as of 03/26/2018 10:05  Ref. Range 03/26/2018 01:47 03/26/2018 03:44 03/26/2018 04:58 03/26/2018 06:05 03/26/2018 07:24 03/26/2018 08:18 03/26/2018 09:22  Glucose-Capillary Latest Ref Range: 70 - 99 mg/dL 409131 (H) 811181 (H) 914185 (H) 161 (H) 141 (H) 154 (H) 179 (H)   Diabetes history: New diagnosis-DKA Outpatient Diabetes medications:  None Current orders for Inpatient glycemic control:  IV insulin/DKA order set Inpatient Diabetes Program Recommendations:   Patient remains on insulin drip as AG still increased and CO2 low.  When patient is appropriate for transition off insulin drip, may consider Levemir 18 units bid and Novolog moderate correction.  He will likely need meal coverage as well once diet started.  Will see patient today as he will need insulin/diabetes teaching.  May consider also checking c-peptide level as well as GAD and insulin antibodies to assist in determining type of diabetes?  Thanks  Beryl MeagerJenny Lopez Dentinger, RN, BC-ADM Inpatient Diabetes Coordinator Pager [phone] (8a-5p)

## 2018-03-26 NOTE — Progress Notes (Addendum)
NAME:  [facility], MRN:  [id number], DOB:  11/09/1995, LOS: 1 ADMISSION DATE:  03/25/2018, CONSULTATION DATE:  03/25/18 REFERRING MD:  Dr. Anitra Lauth in ED, CHIEF COMPLAINT:  DKA   Brief History   23 year old with no significant past medical history admitted with nausea, vomiting, weakness, polyuria, polydipsia. Found to be in new DKA with AKI, hyperkalemia, severe acidosis.  PCCM consulted for admission  History of present illness   Patient presenting with several weeks of "not feelig well" but worsening over last 24h. On arrival to ED blood glucose >600 and diagnosed with DKA. Hyperkalemic to K of 7 with peaked t waves on EKG. Patient admitted to [facility] for further management of DKA and electrolyte abnormalities.   Patient also with AKI with Cr of 4.6 on admission.   Past Medical History  None   Significant Hospital Events   2/9 - Admitted to PCCM for DKA   Consults:  PCCM   Procedures:  None  Significant Diagnostic Tests:  EKG 2/9 - Peaked T waves  Micro Data:  MRSA PCR neg (2/9)  Antimicrobials:  None   Interim history/subjective:  Patient states he feels improved today. Denies abdominal pain, n/v/d. States he did not have any diarrhea or vomiting prior to admission. Patient hopeful for diet soon.   Objective   Blood pressure 132/71, pulse (!) 101, temperature 98.2 F (36.8 C), temperature source Oral, resp. rate 17, height 6\' 1"  (1.854 m), weight 120.9 kg, SpO2 92 %.        Intake/Output Summary (Last 24 hours) at 03/26/2018 0751 Last data filed at 03/26/2018 0700 Gross per 24 hour  Intake 7011.86 ml  Output 1550 ml  Net 5461.86 ml   Filed Weights   03/25/18 1400  Weight: 120.9 kg    Examination: General: sleepy but arousable. NAD HENT: dry mucous membranes Lungs: CTAB, no wheezes, rales, or rhonchi  Cardiovascular: RRR, no MRG  Abdomen: soft, non tender, non distended, bowel sounds normal  Extremities: no edema  Neuro: no focal deficits  GU: not  examined   Resolved Hospital Problem list     Assessment & Plan:  New onset diabetes presenting with DKA Glucose improving 720>401>225>154>171 Start insulin drip per DKA protocol AG continues to remain elevated, when closed x2 can transition to phase 2 protocol  2L LR bolus Will check pancreatic enzyme levels (amylase, lipase) Will monitor CK to ensure no rhabdomyolysis  UDS HIV  Vomiting Patient with continued vomiting. Per mother some coffee ground emesis Will check CBC Start protonix  IV phenergan PRN (Prolonged QTC to 504)  Prolonged QTc QTC of 504  Avoid QTc prolonging medications   Acute kidney injury, hyperkalemia. Improving  Cr 3.68>2.97>2.16>1.73>1.54. K 5.3>4.7>3.9>4.1>3.7 Follow BUN and creatinine AM EKG pending   Leukocytosis Likely stress response Follow cultures.  Observe off antibiotics. AM CBC pending   Best practice:  Diet: NPO while on insulin gtt, can transition to diet after AG is closed x2 Pain/Anxiety/Delirium protocol (if indicated): None  VAP protocol (if indicated): N/A  DVT prophylaxis: Lovenox  GI prophylaxis: N/A  Glucose control: Insulin gtt  Mobility: BR  Code Status: Full  Family Communication: no family at bedside  Disposition: Currently in ICU, will transfer to hospitalist service in AM 2/11  Labs   CBC: Recent Labs  Lab 03/25/18 0738 03/25/18 0747 03/25/18 1115  WBC 19.1*  --  19.8*  NEUTROABS 16.0*  --   --   HGB 16.4 18.4* 15.7  HCT 53.1* 54.0* 50.1  MCV 95.7  --  94.0  PLT 204  --  168    Basic Metabolic Panel: Recent Labs  Lab 03/25/18 1115 03/25/18 1422 03/25/18 1828 03/25/18 2249 03/26/18 0216 03/26/18 0631  NA 126* 129* 136 136 140 141  K 7.2* 5.3* 4.7 3.9 4.1 3.7  CL 89* 96* 102 102 106 106  CO2 <7* 8* 12* 16* 15* 17*  GLUCOSE 904* 720* 401* 225* 154* 171*  BUN 49* 51* 46* 41* 36* 27*  CREATININE 3.70* 3.68* 2.97* 2.16* 1.73* 1.54*  CALCIUM 8.5* 8.4* 9.1 8.9 9.3 9.3  MG 2.9*  --   --   --   --    --   PHOS 8.3*  --   --   --   --   --    GFR: Estimated Creatinine Clearance: 104.2 mL/min (A) (by C-G formula based on SCr of 1.54 mg/dL (H)). Recent Labs  Lab 03/25/18 0738 03/25/18 1115 03/25/18 1631  WBC 19.1* 19.8*  --   LATICACIDVEN  --   --  1.8    Liver Function Tests: Recent Labs  Lab 03/25/18 0738  AST 24  ALT 36  ALKPHOS 83  BILITOT 1.9*  PROT 8.2*  ALBUMIN 4.4   Recent Labs  Lab 03/25/18 1115  LIPASE 1,146*  AMYLASE 609*   No results for input(s): AMMONIA in the last 168 hours.  ABG    Component Value Date/Time   HCO3 5.6 (L) 03/25/2018 0747   TCO2 6 (L) 03/25/2018 0747   ACIDBASEDEF 23.0 (H) 03/25/2018 0747   O2SAT 89.0 03/25/2018 0747     Coagulation Profile: No results for input(s): INR, PROTIME in the last 168 hours.  Cardiac Enzymes: No results for input(s): CKTOTAL, CKMB, CKMBINDEX, TROPONINI in the last 168 hours.  HbA1C: No results found for: HGBA1C  CBG: Recent Labs  Lab 03/26/18 0147 03/26/18 0344 03/26/18 0458 03/26/18 0605 03/26/18 0724  GLUCAP 131* 181* 185* 161* 141*    Review of Systems:   10 point review of systems negative   Critical care time: 30 min

## 2018-03-26 NOTE — Progress Notes (Signed)
Patient restless wants to get into chair. Once in chair patient states he doesn't feel well. B/p decreased see Flowsheet. Head placed back patient feeling better remains in chair per his request. Mother at bedside and MD notified.

## 2018-03-27 ENCOUNTER — Encounter (HOSPITAL_COMMUNITY): Payer: Self-pay | Admitting: *Deleted

## 2018-03-27 DIAGNOSIS — E1110 Type 2 diabetes mellitus with ketoacidosis without coma: Secondary | ICD-10-CM

## 2018-03-27 DIAGNOSIS — N179 Acute kidney failure, unspecified: Secondary | ICD-10-CM

## 2018-03-27 DIAGNOSIS — E875 Hyperkalemia: Secondary | ICD-10-CM

## 2018-03-27 LAB — BASIC METABOLIC PANEL
ANION GAP: 14 (ref 5–15)
Anion gap: 13 (ref 5–15)
Anion gap: 13 (ref 5–15)
Anion gap: 16 — ABNORMAL HIGH (ref 5–15)
BUN: 11 mg/dL (ref 6–20)
BUN: 10 mg/dL (ref 6–20)
BUN: 9 mg/dL (ref 6–20)
BUN: 13 mg/dL (ref 6–20)
CO2: 17 mmol/L — ABNORMAL LOW (ref 22–32)
CO2: 19 mmol/L — ABNORMAL LOW (ref 22–32)
CO2: 20 mmol/L — ABNORMAL LOW (ref 22–32)
CO2: 16 mmol/L — ABNORMAL LOW (ref 22–32)
Calcium: 8.5 mg/dL — ABNORMAL LOW (ref 8.9–10.3)
Calcium: 9 mg/dL (ref 8.9–10.3)
Calcium: 8.9 mg/dL (ref 8.9–10.3)
Calcium: 8.9 mg/dL (ref 8.9–10.3)
Chloride: 104 mmol/L (ref 98–111)
Chloride: 106 mmol/L (ref 98–111)
Chloride: 104 mmol/L (ref 98–111)
Chloride: 105 mmol/L (ref 98–111)
Creatinine, Ser: 1.18 mg/dL (ref 0.61–1.24)
Creatinine, Ser: 1.13 mg/dL (ref 0.61–1.24)
Creatinine, Ser: 1.12 mg/dL (ref 0.61–1.24)
Creatinine, Ser: 1.23 mg/dL (ref 0.61–1.24)
GFR calc Af Amer: >60 mL/min (ref >60)
GFR calc Af Amer: >60 mL/min (ref >60)
GFR calc Af Amer: >60 mL/min (ref >60)
GFR calc non Af Amer: >60 mL/min (ref >60)
GFR calc non Af Amer: >60 mL/min (ref >60)
GFR calc non Af Amer: >60 mL/min (ref >60)
GFR calc non Af Amer: >60 mL/min (ref >60)
Glucose, Bld: 425 mg/dL — ABNORMAL HIGH (ref 70–99)
Glucose, Bld: 188 mg/dL — ABNORMAL HIGH (ref 70–99)
Glucose, Bld: 172 mg/dL — ABNORMAL HIGH (ref 70–99)
Glucose, Bld: 220 mg/dL — ABNORMAL HIGH (ref 70–99)
Potassium: 4.9 mmol/L (ref 3.5–5.1)
Potassium: 3.2 mmol/L — ABNORMAL LOW (ref 3.5–5.1)
Potassium: 2.9 mmol/L — ABNORMAL LOW (ref 3.5–5.1)
Potassium: 3.4 mmol/L — ABNORMAL LOW (ref 3.5–5.1)
SODIUM: 138 mmol/L (ref 135–145)
SODIUM: 134 mmol/L — AB (ref 135–145)
Sodium: 138 mmol/L (ref 135–145)
Sodium: 137 mmol/L (ref 135–145)

## 2018-03-27 LAB — GLUCOSE, CAPILLARY
GLUCOSE-CAPILLARY: 157 mg/dL — AB (ref 70–99)
Glucose-Capillary: 154 mg/dL — ABNORMAL HIGH (ref 70–99)
Glucose-Capillary: 167 mg/dL — ABNORMAL HIGH (ref 70–99)
Glucose-Capillary: 173 mg/dL — ABNORMAL HIGH (ref 70–99)
Glucose-Capillary: 164 mg/dL — ABNORMAL HIGH (ref 70–99)
Glucose-Capillary: 175 mg/dL — ABNORMAL HIGH (ref 70–99)
Glucose-Capillary: 160 mg/dL — ABNORMAL HIGH (ref 70–99)
Glucose-Capillary: 169 mg/dL — ABNORMAL HIGH (ref 70–99)
Glucose-Capillary: 179 mg/dL — ABNORMAL HIGH (ref 70–99)
Glucose-Capillary: 168 mg/dL — ABNORMAL HIGH (ref 70–99)
Glucose-Capillary: 165 mg/dL — ABNORMAL HIGH (ref 70–99)
Glucose-Capillary: 158 mg/dL — ABNORMAL HIGH (ref 70–99)
Glucose-Capillary: 153 mg/dL — ABNORMAL HIGH (ref 70–99)
Glucose-Capillary: 217 mg/dL — ABNORMAL HIGH (ref 70–99)
Glucose-Capillary: 212 mg/dL — ABNORMAL HIGH (ref 70–99)

## 2018-03-27 LAB — MAGNESIUM: Magnesium: 2.1 mg/dL (ref 1.7–2.4)

## 2018-03-27 LAB — CBC
HEMATOCRIT: 39.9 % (ref 39.0–52.0)
Hemoglobin: 13.8 g/dL (ref 13.0–17.0)
MCH: 29.9 pg (ref 26.0–34.0)
MCHC: 34.6 g/dL (ref 30.0–36.0)
MCV: 86.4 fL (ref 80.0–100.0)
Platelets: 120 10*3/uL — ABNORMAL LOW (ref 150–400)
RBC: 4.62 MIL/uL (ref 4.22–5.81)
RDW: 13.2 % (ref 11.5–15.5)
WBC: 10.9 10*3/uL — ABNORMAL HIGH (ref 4.0–10.5)
nRBC: 0 % (ref 0.0–0.2)

## 2018-03-27 LAB — TSH: TSH: 0.373 u[IU]/mL (ref 0.350–4.500)

## 2018-03-27 LAB — PHOSPHORUS: Phosphorus: 2.2 mg/dL — ABNORMAL LOW (ref 2.5–4.6)

## 2018-03-27 LAB — HEPATIC FUNCTION PANEL
ALK PHOS: 51 U/L (ref 38–126)
ALT: 25 U/L (ref 0–44)
AST: 24 U/L (ref 15–41)
Albumin: 3.4 g/dL — ABNORMAL LOW (ref 3.5–5.0)
BILIRUBIN INDIRECT: 1.3 mg/dL — AB (ref 0.3–0.9)
Bilirubin, Direct: 0.2 mg/dL (ref 0.0–0.2)
Total Bilirubin: 1.5 mg/dL — ABNORMAL HIGH (ref 0.3–1.2)
Total Protein: 6.3 g/dL — ABNORMAL LOW (ref 6.5–8.1)

## 2018-03-27 MED ORDER — INSULIN STARTER KIT- PEN NEEDLES (ENGLISH)
1.0000 | Freq: Once | Status: AC
  Administered 2018-03-27: 1
  Filled 2018-03-27: qty 1

## 2018-03-27 MED ORDER — INSULIN DETEMIR 100 UNIT/ML ~~LOC~~ SOLN
24.0000 [IU] | Freq: Two times a day (BID) | SUBCUTANEOUS | Status: DC
  Administered 2018-03-27 – 2018-03-28 (×2): 24 [IU] via SUBCUTANEOUS
  Filled 2018-03-27 (×3): qty 0.24

## 2018-03-27 MED ORDER — POTASSIUM CHLORIDE 10 MEQ/100ML IV SOLN
10.0000 meq | INTRAVENOUS | Status: AC
  Administered 2018-03-27 (×4): 10 meq via INTRAVENOUS
  Filled 2018-03-27 (×4): qty 100

## 2018-03-27 MED ORDER — INSULIN ASPART 100 UNIT/ML ~~LOC~~ SOLN
0.0000 [IU] | Freq: Every day | SUBCUTANEOUS | Status: DC
  Administered 2018-03-27: 2 [IU] via SUBCUTANEOUS

## 2018-03-27 MED ORDER — LIVING WELL WITH DIABETES BOOK
Freq: Once | Status: AC
  Administered 2018-03-27: 17:00:00
  Filled 2018-03-27: qty 1

## 2018-03-27 MED ORDER — ENOXAPARIN SODIUM 60 MG/0.6ML ~~LOC~~ SOLN
60.0000 mg | SUBCUTANEOUS | Status: DC
  Administered 2018-03-27 – 2018-03-28 (×2): 60 mg via SUBCUTANEOUS
  Filled 2018-03-27 (×2): qty 0.6

## 2018-03-27 MED ORDER — INSULIN ASPART 100 UNIT/ML ~~LOC~~ SOLN
0.0000 [IU] | Freq: Three times a day (TID) | SUBCUTANEOUS | Status: DC
  Administered 2018-03-27: 5 [IU] via SUBCUTANEOUS
  Administered 2018-03-27: 3 [IU] via SUBCUTANEOUS
  Administered 2018-03-28 (×2): 5 [IU] via SUBCUTANEOUS

## 2018-03-27 MED ORDER — INSULIN DETEMIR 100 UNIT/ML ~~LOC~~ SOLN
18.0000 [IU] | Freq: Two times a day (BID) | SUBCUTANEOUS | Status: DC
  Administered 2018-03-27: 18 [IU] via SUBCUTANEOUS
  Filled 2018-03-27 (×2): qty 0.18

## 2018-03-27 MED ORDER — SODIUM CHLORIDE 0.9 % IV SOLN
INTRAVENOUS | Status: DC
  Administered 2018-03-27 – 2018-03-28 (×3): via INTRAVENOUS

## 2018-03-27 NOTE — Progress Notes (Signed)
Inpatient Diabetes Program Recommendations  AACE/ADA: New Consensus Statement on Inpatient Glycemic Control (2015)  Target Ranges:  Prepandial:   less than 140 mg/dL      Peak postprandial:   less than 180 mg/dL (1-2 hours)      Critically ill patients:  140 - 180 mg/dL   Lab Results  Component Value Date   GLUCAP 153 (H) 03/27/2018    Newly diagnosed DM.   Met with patient and his two friends regarding his new diabetes diagnosis. One of his friends states Terry Jensen lives with her and she will be helping him with his care. [name] and his two friends were very engaged in listening and participating in this teaching session. Reviewed the "living well with diabetes" book including s/s of hypo/hyperglycemia and treatment of both. Discussed the difference between type 1 and type 2 diabetes.   Showed patient vial/syringe and insulin pen. He has [place] [facility] and states he prefers the INSULIN PEN. Educated patient on insulin pen use at home. Reviewed contents of insulin flexpen starter kit. Reviewed all steps of insulin pen including attachment of needle, 2-unit air shot, dialing up dose, giving injection, rotation of injection sites, removing needle, disposal of sharps, storage of unused insulin, disposal of insulin etc. Patient able to provide successful return demonstration.     Have asked RNs caring for patient to please allow patient to give all injections here in hospital as much as possible for practice. MD to give patient Rxs for insulin pens and insulin pen needles.   Patient does not have a primary MD. Discussed with patient (and his friends) the necessity of having a MD to manage his diabetes care. I discussed with nurse case manager and as he has insurance she suggested that he call the phone # on the back of his insurance card to find out preferred providers. Patient's friend that he lives with said she would make sure he calls as gets a primary MD asap. Also gave patient list of area  endocrinologists to call (after primary MD secured) to set up appt as suggested per his MD earlier today. Patient is interested in making endo appt as wants to know more about his diabetes diagnosis (being determined - likely type 1 per notes).   Patient was interested in talking to dietician inpatient (ordered) as well as a follow up outpatient [facility]t with [facility] (MD to please order).   MD prior to discharge please order:   1. Order "ambulatory referral to [facility]" for outpatient dietician appt  2. Insulin PEN and insulin PEN needles  (patent preference to use a pen)   3. Blood glucose meter kit (includes lancets and strips) #[id number]   Thank you.  -- Will follow during hospitalization.--  Terry Clark RN, MSN Diabetes Coordinator Inpatient Glycemic Control Team Team Pager: [phone] (8am-5pm)

## 2018-03-27 NOTE — Progress Notes (Signed)
[facility] TEAM 1 - Stepdown/ICU TEAM  HEMINGWAY MCRILL  QGB:[id number] DOB: [birth year] DOA: 03/25/2018 PCP: Patient, No Pcp Per    Brief Narrative:  23yo with no significant medical history who was admitted withnausea, vomiting, weakness, polyuria, and polydipsia. He was found to be inDKA with AKI, hyperkalemia, and severe acidosis. DM is a new diagnosis for him.   Significant Events: 2/9 admit in DKA - new DM  Subjective: The patient is resting comfortably in bed.  He states he feels much better.  His vomiting and nausea have resolved.  He is very hungry and anxious to be allowed to eat.  He reports that he was not aware that he had diabetes.  He is otherwise healthy and therefore has not required regular medical attention.  I spent a lengthy period of time with the patient and his family beginning the process of diabetes education.  I explained to him the dangers of hypoglycemia as well as hyperglycemia and DKA to include irreversible brain damage or even death.  I have explained to him that extensive ongoing diabetes education will be required.  Assessment & Plan:  DKA - Newly diagnosed uncontrolled DM DKA now resolved - transition off insulin gtt - initiate levemir + SSI for now - may require meal dosing as well - begin extensive DM education process - pt would likley benefit from Endocrinology f/u - further outpt testing will be indicated to confirm, but I suspect this is a type 1 situation where only insulin will be adequate - I have explained to him that insulin is the safest option for now, and until further testing can be completed in the outpt setting - recheck BMET this afternoon and in AM to assure he remains out of DKA    Acute kidney injury Creatinine 3.68 at presentation - has now essentially resolved w/ volume expansion   Intractable vomiting Due to DKA - resolved   Prolonged QTC Likely related to electrolyte derangements in setting of DKA - recheck 12 lead today - follow  on tele   Obesity - Body mass index is 35.81 kg/m.   DVT prophylaxis: lovenox  Code Status: FULL CODE Family Communication: no family present at time of exam  Disposition Plan: stable for tele bed transfer   Consultants:  PCCM  Antimicrobials:  none  Objective: Blood pressure (!) 146/82, pulse (!) 109, temperature 98.7 F (37.1 C), temperature source Oral, resp. rate (!) 21, height 6\' 1"  (1.854 m), weight 123.1 kg, SpO2 (!) 85 %.  Intake/Output Summary (Last 24 hours) at 03/27/2018 0951 Last data filed at 03/27/2018 0900 Gross per 24 hour  Intake 6284.11 ml  Output 1775 ml  Net 4509.11 ml   Filed Weights   03/25/18 1400 03/27/18 0443  Weight: 120.9 kg 123.1 kg    Examination: General: No acute respiratory distress Lungs: Clear to auscultation bilaterally without wheezes or crackles Cardiovascular: Regular rate and rhythm without murmur gallop or rub normal S1 and S2 Abdomen: Nontender, nondistended, soft, bowel sounds positive, no rebound, no ascites, no appreciable mass Extremities: No significant cyanosis, clubbing, or edema bilateral lower extremities  CBC: Recent Labs  Lab 03/25/18 0738  03/25/18 1115 03/26/18 1131 03/27/18 0434  WBC 19.1*  --  19.8* 12.7* 10.9*  NEUTROABS 16.0*  --   --   --   --   HGB 16.4   < > 15.7 15.6 13.8  HCT 53.1*   < > 50.1 44.4 39.9  MCV 95.7  --  94.0 85.2  86.4  PLT 204  --  168 136* 120*   < > = values in this interval not displayed.   Basic Metabolic Panel: Recent Labs  Lab 03/25/18 1115  03/26/18 1131  03/26/18 2354 03/27/18 0434 03/27/18 0847  NA 126*   < > 143   < > 134* 138 138  K 7.2*   < > 4.0   < > 4.9 3.2* 2.9*  CL 89*   < > 112*   < > 104 106 104  CO2 <7*   < > 15*   < > 17* 19* 20*  GLUCOSE 904*   < > 154*   < > 425* 188* 172*  BUN 49*   < > 22*   < > 13 11 10   CREATININE 3.70*   < > 1.38*   < > 1.18 1.13 1.12  CALCIUM 8.5*   < > 9.3   < > 8.5* 9.0 8.9  MG 2.9*  --  2.7*  --   --  2.1  --   PHOS 8.3*   --  1.9*  --   --  2.2*  --    < > = values in this interval not displayed.   GFR: Estimated Creatinine Clearance: 144.6 mL/min (by C-G formula based on SCr of 1.12 mg/dL).  Liver Function Tests: Recent Labs  Lab 03/25/18 0738 03/27/18 0434  AST 24 24  ALT 36 25  ALKPHOS 83 51  BILITOT 1.9* 1.5*  PROT 8.2* 6.3*  ALBUMIN 4.4 3.4*   Recent Labs  Lab 03/25/18 1115 03/26/18 1131  LIPASE 1,146* 38  AMYLASE 609* 88    Cardiac Enzymes: Recent Labs  Lab 03/26/18 1131  CKTOTAL 625*    HbA1C: No results found for: HGBA1C  CBG: Recent Labs  Lab 03/27/18 0433 03/27/18 0538 03/27/18 0640 03/27/18 0735 03/27/18 0920  GLUCAP 160* 169* 179* 168* 165*    Recent Results (from the past 240 hour(s))  MRSA PCR Screening     Status: None   Collection Time: 03/25/18 12:22 PM  Result Value Ref Range Status   MRSA by PCR NEGATIVE NEGATIVE Final    Comment:        The GeneXpert MRSA Assay (FDA approved for NASAL specimens only), is one component of a comprehensive MRSA colonization surveillance program. It is not intended to diagnose MRSA infection nor to guide or monitor treatment for MRSA infections. Performed at [facility], [address] [place], [place] [address]      Scheduled Meds: . enoxaparin (LOVENOX) injection  40 mg Subcutaneous Q24H  . insulin aspart  0-15 Units Subcutaneous TID WC  . insulin aspart  0-5 Units Subcutaneous QHS  . insulin detemir  18 Units Subcutaneous BID  . pneumococcal 23 valent vaccine  0.5 mL Intramuscular Tomorrow-1000     LOS: 2 days   Lonia BloodJeffrey T. Candyce Gambino, MD [facility] Office  [phone] Pager - Text Page per Amion  If 7PM-7AM, please contact night-coverage per Amion 03/27/2018, 9:51 AM

## 2018-03-27 NOTE — Progress Notes (Signed)
eLink Physician-Brief Progress Note Patient Name: Gershon Cullravon W Ridge DOB: [birth year] MRN: [id number]   Date of Service  03/27/2018  HPI/Events of Note  K+ = 3.2 and Creatinine = 1.13.  eICU Interventions  Will replace K+.      Intervention Category Major Interventions: Electrolyte abnormality - evaluation and management  Sommer,Steven Eugene 03/27/2018, 6:12 AM

## 2018-03-28 DIAGNOSIS — E1010 Type 1 diabetes mellitus with ketoacidosis without coma: Principal | ICD-10-CM

## 2018-03-28 LAB — GLUCOSE, CAPILLARY
Glucose-Capillary: 207 mg/dL — ABNORMAL HIGH (ref 70–99)
Glucose-Capillary: 224 mg/dL — ABNORMAL HIGH (ref 70–99)
Glucose-Capillary: 191 mg/dL — ABNORMAL HIGH (ref 70–99)

## 2018-03-28 LAB — BASIC METABOLIC PANEL
Anion gap: 13 (ref 5–15)
BUN: 8 mg/dL (ref 6–20)
CO2: 20 mmol/L — ABNORMAL LOW (ref 22–32)
Calcium: 8.5 mg/dL — ABNORMAL LOW (ref 8.9–10.3)
Chloride: 102 mmol/L (ref 98–111)
Creatinine, Ser: 1.09 mg/dL (ref 0.61–1.24)
GFR calc Af Amer: >60 mL/min (ref >60)
Glucose, Bld: 197 mg/dL — ABNORMAL HIGH (ref 70–99)
Potassium: 3.3 mmol/L — ABNORMAL LOW (ref 3.5–5.1)
Sodium: 135 mmol/L (ref 135–145)

## 2018-03-28 LAB — PHOSPHORUS: Phosphorus: 2.8 mg/dL (ref 2.5–4.6)

## 2018-03-28 MED ORDER — INSULIN LISPRO (1 UNIT DIAL) 100 UNIT/ML (KWIKPEN): 4.0000 [IU] | PEN_INJECTOR | Freq: Three times a day (TID) | SUBCUTANEOUS | 0 refills | Status: DC

## 2018-03-28 MED ORDER — POTASSIUM CHLORIDE CRYS ER 20 MEQ PO TBCR
20.0000 meq | EXTENDED_RELEASE_TABLET | Freq: Once | ORAL | Status: AC
  Administered 2018-03-28: 20 meq via ORAL
  Filled 2018-03-28: qty 1

## 2018-03-28 MED ORDER — POTASSIUM CHLORIDE CRYS ER 20 MEQ PO TBCR
40.0000 meq | EXTENDED_RELEASE_TABLET | Freq: Once | ORAL | Status: AC
  Administered 2018-03-28: 40 meq via ORAL
  Filled 2018-03-28: qty 2

## 2018-03-28 MED ORDER — INSULIN PEN NEEDLE 31G X 5 MM MISC: 0 refills | Status: DC

## 2018-03-28 MED ORDER — INSULIN DETEMIR 100 UNIT/ML FLEXPEN: 24.0000 [IU] | PEN_INJECTOR | Freq: Every day | SUBCUTANEOUS | 0 refills | Status: DC

## 2018-03-28 MED ORDER — BASAGLAR KWIKPEN 100 UNIT/ML ~~LOC~~ SOPN: 24.0000 [IU] | PEN_INJECTOR | Freq: Two times a day (BID) | SUBCUTANEOUS | 0 refills | Status: DC

## 2018-03-28 MED ORDER — BLOOD GLUCOSE METER KIT: PACK | 0 refills | Status: DC

## 2018-03-28 MED ORDER — INSULIN ASPART 100 UNIT/ML FLEXPEN: 4.0000 [IU] | PEN_INJECTOR | Freq: Three times a day (TID) | SUBCUTANEOUS | 0 refills | Status: DC

## 2018-03-28 NOTE — Progress Notes (Signed)
Terry Jensen to be D/C'd Home per MD order.  Discussed prescriptions and follow up appointments with the patient. Prescriptions given to patient, medication list explained in detail. Pt verbalized understanding.  Allergies as of 03/28/2018   No Known Allergies     Medication List    TAKE these medications   BASAGLAR KWIKPEN 100 UNIT/ML Sopn Inject 0.24 mLs (24 Units total) into the skin 2 (two) times daily for 30 days.   blood glucose meter kit and supplies Dispense based on patient and insurance preference. Use up to four times daily as directed. (FOR ICD-10 E10.9, E11.9).   diclofenac 75 MG EC tablet Commonly known as:  VOLTAREN Take 1 tablet (75 mg total) by mouth 2 (two) times daily.   HYDROcodone-acetaminophen 5-325 MG tablet Commonly known as:  NORCO/VICODIN Take 1-2 tabs PO Q6h prn pain not relieved by Tylenol   insulin lispro 100 UNIT/ML KwikPen Commonly known as:  HUMALOG KWIKPEN Inject 0.04 mLs (4 Units total) into the skin 3 (three) times daily after meals for 30 days.   Insulin Pen Needle 31G X 5 MM Misc Please along with Basaglar flex pen BID and humalog kwik pen TID w meal.   losartan-hydrochlorothiazide 50-12.5 MG tablet Commonly known as:  HYZAAR Take 1 tablet by mouth daily.   methocarbamol 500 MG tablet Commonly known as:  ROBAXIN Take 1-2 tablets (500-1,000 mg total) by mouth every 6 (six) hours as needed for muscle spasms.       Vitals:   03/28/18 0504 03/28/18 0829  BP: (!) 141/77 (!) 150/88  Pulse: 89 86  Resp: 18 18  Temp: 97.8 F (36.6 C) 98.5 F (36.9 C)  SpO2: 100% 100%    Skin clean, dry and intact without evidence of skin break down, no evidence of skin tears noted. IV catheter discontinued intact. Site without signs and symptoms of complications. Dressing and pressure applied. Pt denies pain at this time. No complaints noted.  An After Visit Summary was printed and given to the patient. Patient escorted via [place], and D/C home via  private auto.

## 2018-03-28 NOTE — Discharge Summary (Addendum)
Physician Discharge Summary  Terry Jensen WUJ:[id number] DOB: Dec 12, 1995 DOA: 03/25/2018  PCP: Patient, No Pcp Per  Admit date: 03/25/2018 Discharge date: 03/28/2018  Admitted From: home Disposition: Home  Recommendations for Outpatient Follow-up:  1. Follow up with PCP in 1-2 weeks 2. Please obtain BMP/CBC in one week 3. Please monitor blood sugar 4 times a day   Home Health:no  Equipment/Devices:DM supplies  Discharge Condition:stable  CODE STATUS:full  Diet recommendation: Diabetic/low carbohydrate diet  Brief/Interim Summary: 23 year old male  With no prior past medical history presented to ER on 03/25/2018 with nausea, vomiting weakness polyuria polydipsia.  He was found to be in DKA along with AKI, hyperkalemia, severe acidosis.  Patient was admitted to intensive care unit on insulin drip and aggressive IV fluid hydration.  Patient was transferred out of ICU.  Patient was seen by [facility] 2/11.  Patient was continued on 24 units Lantus twice daily along with sliding scale insulin.  He was seen by diabetic educator/dietitian and provided extensive diabetic education including diet control.  He was discussed and taught about insulin injection blood sugar monitoring.  I also discussed with him extensively regarding hyperglycemia, hypoglycemia cardiopulmonary them and how to treat them at home.  Patient is aware about the risk of hypoglycemia and tender associated.  He has verbalized understanding and is agreeable to continuing insulin He is going to see a new primary care doctor soon and monitor blood sugar 4 times a day at home.  Patient will also be discharged on 4 uint TID humalog insulin premeal only and not to be taken if not eating meal.  Discharge Diagnoses:   DKA: Resolved.  Likely type 1 diabetes.  Patient is to continue on insulin glarginie ( his insurance preferred formualry) twice daily with close monitoring of blood sugar at home 4 times a day.  Is to follow-up with  PCP and local endocrinologist.  I have provided him with community clinic number in case he is not able to find a PCP soon.  He will need to call the no for follow-up.  AKI: Chronic severe dehydration/prerenal due to above.  Resolved.  Tolerating diet. Prolonged QTC likely from electrolyte imbalance.  QTC has improved 420s Obesity with BMI 35.9.  Advised healthy lifestyle, weight loss. CM has provided Glucose meter kit.  Discharge Instructions 1.sent "ambulatory referral to [facility]" for outpatient dietician appt  2. Insulin PEN and insulin PEN needles will be arrange.   3. Blood glucose meter kit (includes lancets and strips) #[id number].  Discharge Instructions    Amb Referral to Nutrition and Diabetic E   Complete by:  As directed    Ambulatory referral to Nutrition and Diabetic Education   Complete by:  As directed    Call MD for:  persistant nausea and vomiting   Complete by:  As directed    Diet Carb Modified   Complete by:  As directed    Discharge instructions   Complete by:  As directed    Check blood sugar 4 times a day ( before meals and bedtime). If blood sugar running above 200 less than 70 please call your MD to adjust insulin. If blood sugars running less 100 you may need to hold insulin and call MD. If you noticed signs and symptoms of hypoglycemia or low blood sugar like jitteriness, confusion, thirst, tremor, sweating- Check blood sugar, drink sugary drink/biscuits/sweets to increase sugar level and call MD or return to ER.   Increase activity slowly  Complete by:  As directed      Allergies as of 03/28/2018   No Known Allergies     Medication List    TAKE these medications   BASAGLAR KWIKPEN 100 UNIT/ML Sopn Inject 0.24 mLs (24 Units total) into the skin 2 (two) times daily for 30 days.   blood glucose meter kit and supplies Dispense based on patient and insurance preference. Use up to four times daily as directed. (FOR ICD-10  E10.9, E11.9).   diclofenac 75 MG EC tablet Commonly known as:  VOLTAREN Take 1 tablet (75 mg total) by mouth 2 (two) times daily.   HYDROcodone-acetaminophen 5-325 MG tablet Commonly known as:  NORCO/VICODIN Take 1-2 tabs PO Q6h prn pain not relieved by Tylenol   insulin lispro 100 UNIT/ML KwikPen Commonly known as:  HUMALOG KWIKPEN Inject 0.04 mLs (4 Units total) into the skin 3 (three) times daily after meals for 30 days.   Insulin Pen Needle 31G X 5 MM Misc Please along with Basaglar flex pen BID and humalog kwik pen TID w meal.   losartan-hydrochlorothiazide 50-12.5 MG tablet Commonly known as:  HYZAAR Take 1 tablet by mouth daily.   methocarbamol 500 MG tablet Commonly known as:  ROBAXIN Take 1-2 tablets (500-1,000 mg total) by mouth every 6 (six) hours as needed for muscle spasms.      Follow-up Information    [facility]. Call in 1 week(s).   Contact information: [place] [phone]         No Known Allergies  Consultations:     Procedures/Studies: Dg Chest Port 1 View  Result Date: 03/25/2018 CLINICAL DATA:  23 year old male with history of shortness of breath. Weakness. EXAM: PORTABLE CHEST 1 VIEW COMPARISON:  None. FINDINGS: Lung volumes are low. No consolidative airspace disease. No pleural effusions. No pneumothorax. No pulmonary nodule or mass noted. Pulmonary vasculature and the cardiomediastinal silhouette are within normal limits. IMPRESSION: Low lung volumes without radiographic evidence of acute cardiopulmonary disease. Electronically Signed   By: Vinnie Langton M.D.   On: 03/25/2018 11:37     Subjective: Resting well no new complaints.  Discharge Exam: Vitals:   03/28/18 0504 03/28/18 0829  BP: (!) 141/77 (!) 150/88  Pulse: 89 86  Resp: 18 18  Temp: 97.8 F (36.6 C) 98.5 F (36.9 C)  SpO2: 100% 100%   Vitals:   03/27/18 1633 03/27/18 2023 03/28/18 0504  03/28/18 0829  BP: (!) 148/88 (!) 146/85 (!) 141/77 (!) 150/88  Pulse: 90 91 89 86  Resp: _0 Temp: 98.3 F (36.8 C) 98.8 F (37.1 C) 97.8 F (36.6 C) 98.5 F (36.9 C)  TempSrc: Oral Oral Oral Oral  SpO2: 100% 100% 100% 100%  Weight:  123.3 kg    Height:  _1  (1.854 m)      General: Pt is alert, awake, not in acute distress Cardiovascular: RRR, S1/S2 +, no rubs, no gallops Respiratory: CTA bilaterally, no wheezing, no rhonchi Abdominal: Soft, NT, ND, bowel sounds + Extremities: no edema, no cyanosis   The results of significant diagnostics from this hospitalization (including imaging, microbiology, ancillary and laboratory) are listed below for reference.     Microbiology: Recent Results (from the past 240 hour(s))  MRSA PCR Screening     Status: None   Collection Time: 03/25/18 12:22 PM  Result Value Ref Range Status   MRSA by PCR NEGATIVE NEGATIVE Final    Comment:  The GeneXpert MRSA Assay (FDA approved for NASAL specimens only), is one component of a comprehensive MRSA colonization surveillance program. It is not intended to diagnose MRSA infection nor to guide or monitor treatment for MRSA infections. Performed at [facility], [place] [address], [place], [place] [address]      Labs: BNP (last 3 results) No results for input(s): BNP in the last 8760 hours. Basic Metabolic Panel: Recent Labs  Lab 03/25/18 1115  03/26/18 1131  03/26/18 2354 03/27/18 0434 03/27/18 0847 03/27/18 1700 03/28/18 0707  NA 126*   < > 143   < > 134* 138 138 137 135  K 7.2*   < > 4.0   < > 4.9 3.2* 2.9* 3.4* 3.3*  CL 89*   < > 112*   < > 104 106 104 105 102  CO2 <7*   < > 15*   < > 17* 19* 20* 16* 20*  GLUCOSE 904*   < > 154*   < > 425* 188* 172* 220* 197*  BUN 49*   < > 22*   < > _0 CREATININE 3.70*   < > 1.38*   < > 1.18 1.13 1.12 1.23 1.09  CALCIUM 8.5*   < > 9.3   < > 8.5* 9.0 8.9 8.9 8.5*  MG 2.9*  --  2.7*  --   --  2.1  --   --   --    PHOS 8.3*  --  1.9*  --   --  2.2*  --   --  2.8   < > = values in this interval not displayed.   Liver Function Tests: Recent Labs  Lab 03/25/18 0738 03/27/18 0434  AST 24 24  ALT 36 25  ALKPHOS 83 51  BILITOT 1.9* 1.5*  PROT 8.2* 6.3*  ALBUMIN 4.4 3.4*   Recent Labs  Lab 03/25/18 1115 03/26/18 1131  LIPASE 1,146* 38  AMYLASE 609* 88   No results for input(s): AMMONIA in the last 168 hours. CBC: Recent Labs  Lab 03/25/18 0738 03/25/18 0747 03/25/18 1115 03/26/18 1131 03/27/18 0434  WBC 19.1*  --  19.8* 12.7* 10.9*  NEUTROABS 16.0*  --   --   --   --   HGB 16.4 18.4* 15.7 15.6 13.8  HCT 53.1* 54.0* 50.1 44.4 39.9  MCV 95.7  --  94.0 85.2 86.4  PLT 204  --  168 136* 120*   Cardiac Enzymes: Recent Labs  Lab 03/26/18 1131  CKTOTAL 625*   BNP: Invalid input(s): POCBNP CBG: Recent Labs  Lab 03/27/18 1631 03/27/18 2024 03/28/18 0728 03/28/18 1148 03/28/18 1629  GLUCAP 217* 212* 207* 224* 191*   D-Dimer No results for input(s): DDIMER in the last 72 hours. Hgb A1c No results for input(s): HGBA1C in the last 72 hours. Lipid Profile No results for input(s): CHOL, HDL, LDLCALC, TRIG, CHOLHDL, LDLDIRECT in the last 72 hours. Thyroid function studies Recent Labs    03/27/18 1700  TSH 0.373   Anemia work up No results for input(s): VITAMINB12, FOLATE, FERRITIN, TIBC, IRON, RETICCTPCT in the last 72 hours. Urinalysis    Component Value Date/Time   COLORURINE STRAW (A) 03/25/2018 1143   APPEARANCEUR CLEAR 03/25/2018 1143   LABSPEC 1.020 03/25/2018 1143   PHURINE 5.0 03/25/2018 1143   GLUCOSEU >=500 (A) 03/25/2018 1143   HGBUR MODERATE (A) 03/25/2018 1143   BILIRUBINUR NEGATIVE 03/25/2018 1143   KETONESUR 80 (A) 03/25/2018 1143  PROTEINUR 30 (A) 03/25/2018 1143   UROBILINOGEN 0.2 12/08/2014 1445   NITRITE NEGATIVE 03/25/2018 1143   LEUKOCYTESUR NEGATIVE 03/25/2018 1143   Sepsis Labs Invalid input(s): PROCALCITONIN,  WBC,   LACTICIDVEN Microbiology Recent Results (from the past 240 hour(s))  MRSA PCR Screening     Status: None   Collection Time: 03/25/18 12:22 PM  Result Value Ref Range Status   MRSA by PCR NEGATIVE NEGATIVE Final    Comment:        The GeneXpert MRSA Assay (FDA approved for NASAL specimens only), is one component of a comprehensive MRSA colonization surveillance program. It is not intended to diagnose MRSA infection nor to guide or monitor treatment for MRSA infections. Performed at [facility], [place] [address], [place], [place] [address]      Time coordinating discharge: 35 minutes  SIGNED:   Antonieta Pert, MD  [facility] 03/28/2018, 4:36 PM  If 7PM-7AM, please contact night-coverage [email or web address]

## 2018-03-28 NOTE — Plan of Care (Signed)
Nutrition Education Note  RD consult for Diabetes (suspected Type 1) Education. Pt was admitted with DKA with AKI. Glucose value on arrival was >600. Pt was on SSI.   No results found for: HGBA1C  Handouts given to the Pt were provided from [facility] Nutrition Care Manual and included:  "Carbohydrate Counting for People with Diabetes", "Diabetes Label Reading Tips," and "Using Nutrition Labels: Carbohydrates".  Reviewed diet recall. Pt notes that he really was only eating 1 meal a day. Emphasized steady meals throughout the day in helping to manage glucose levels. Included eating within an hour of waking up and not going more than 4-5 hours without eating. Pt reports that he wasn't feeling too hungry that he was feeling full all the time before admission and that he lost weight  since coming in. Per Pt report, he was at 290lbs, now 270lbs. Per wt encounters no wt loss noted. His appetite is improving since getting DKA under control and blood sugar levels down. Evaluated Pt current knowledge of what a Carbohydrate is and what foods contain CHO. Pt was aware of breads and potatoes. Educated CHO servings are equal to 15g of Carbohydrates. Reviewed nutrition facts label of serving size and number of servings per container--taught Pt the Nutrition Facts Label.  Went through an example Nutrition Food Label and showed Pt the cheat sheet for counting carbs that are not easily calculated. Ex. 37g of CHO would be 2.5 CHO servings, or a snack.       Teach back method used. Had Pt to identify the 2 major factors in carb counting the serving size and the number of grams of CHO in a serving.   Pt expected compliance is good.   Body mass index is 35.86 kg/m. Obesity Class II.   Pt is on CHO Mod. Diet. Pt meal completion has been up to 100%  Labs and medications reviewed. No further nutrition interventions at this time. If further nutrition needs arise, please consult RD. Recommend referral to [facility] outpatient  education. Pt is agreeable.   Burnard Bunting, [facility] Dietetic Intern

## 2018-03-30 ENCOUNTER — Encounter: Payer: Self-pay | Admitting: Internal Medicine

## 2018-03-30 ENCOUNTER — Ambulatory Visit: Payer: 59 | Admitting: Internal Medicine

## 2018-03-30 VITALS — BP 128/90 | HR 86 | Ht 73.0 in | Wt 275.0 lb

## 2018-03-30 DIAGNOSIS — E1110 Type 2 diabetes mellitus with ketoacidosis without coma: Secondary | ICD-10-CM | POA: Insufficient documentation

## 2018-03-30 DIAGNOSIS — E109 Type 1 diabetes mellitus without complications: Secondary | ICD-10-CM | POA: Insufficient documentation

## 2018-03-30 LAB — POCT GLYCOSYLATED HEMOGLOBIN (HGB A1C): Hemoglobin A1C: 12 % — AB (ref 4.0–5.6)

## 2018-03-30 MED ORDER — INSULIN DEGLUDEC 200 UNIT/ML ~~LOC~~ SOPN: 18.0000 [IU] | PEN_INJECTOR | Freq: Every day | SUBCUTANEOUS | 5 refills | Status: DC

## 2018-03-30 MED ORDER — INSULIN PEN NEEDLE 32G X 4 MM MISC: 3 refills | Status: DC

## 2018-03-30 MED ORDER — INSULIN LISPRO (1 UNIT DIAL) 100 UNIT/ML (KWIKPEN): 4.0000 [IU] | PEN_INJECTOR | Freq: Three times a day (TID) | SUBCUTANEOUS | 5 refills | Status: DC

## 2018-03-30 MED ORDER — ONETOUCH ULTRASOFT LANCETS MISC: 3 refills | Status: DC

## 2018-03-30 MED ORDER — GLUCOSE BLOOD VI STRP: ORAL_STRIP | 3 refills | Status: DC

## 2018-03-30 NOTE — Patient Instructions (Addendum)
Please start Tresiba 18 units daily.  If the sugars are >140 in am, then increase Tresiba by 4 units.  Please inject Humalog 15 min before each meal: - 4 units before a smaller meal - 6 units before a regular meal - 8 units before a larger meal or if you have dessert  Please schedule an appt with Oran Rein with nutrition.  Please call and schedule an eye appt with Dr. Randon Goldsmith: [facility]:  Dr. Jeni Salles MD ?  Address: [address] [place], [place], [place] [address]  [phone]  PATIENT INSTRUCTIONS FOR TYPE 2 DIABETES:  **Please join MyChart!** - see attached instructions about how to join if you have not done so already.  DIET AND EXERCISE Diet and exercise is an important part of diabetic treatment.  We recommended aerobic exercise in the form of brisk walking (working between 40-60% of maximal aerobic capacity, similar to brisk walking) for 150 minutes per week (such as 30 minutes five days per week) along with 3 times per week performing 'resistance' training (using various gauge rubber tubes with handles) 5-10 exercises involving the major muscle groups (upper body, lower body and core) performing 10-15 repetitions (or near fatigue) each exercise. Start at half the above goal but build slowly to reach the above goals. If limited by weight, joint pain, or disability, we recommend daily walking in a swimming pool with water up to waist to reduce pressure from joints while allow for adequate exercise.    BLOOD GLUCOSES Monitoring your blood glucoses is important for continued management of your diabetes. Please check your blood glucoses 2-4 times a day: fasting, before meals and at bedtime (you can rotate these measurements - e.g. one day check before the 3 meals, the next day check before 2 of the meals and before bedtime, etc.).   HYPOGLYCEMIA (low blood sugar) Hypoglycemia is usually a reaction to not eating, exercising, or taking too much insulin/ other  diabetes drugs.  Symptoms include tremors, sweating, hunger, confusion, headache, etc. Treat IMMEDIATELY with 15 grams of Carbs: . 4 glucose tablets .  cup regular juice/soda . 2 tablespoons raisins . 4 teaspoons sugar . 1 tablespoon honey Recheck blood glucose in 15 mins and repeat above if still symptomatic/blood glucose <100.  RECOMMENDATIONS TO REDUCE YOUR RISK OF DIABETIC COMPLICATIONS: * Take your prescribed MEDICATION(S) * Follow a DIABETIC diet: Complex carbs, fiber rich foods, (monounsaturated and polyunsaturated) fats * AVOID saturated/trans fats, high fat foods, >2,300 mg salt per day. * EXERCISE at least 5 times a week for 30 minutes or preferably daily.  * DO NOT SMOKE OR DRINK more than 1 drink a day. * Check your FEET every day. Do not wear tightfitting shoes. Contact us if you develop an ulcer * See your EYE doctor once a year or more if needed * Get a FLU shot once a year * Get a PNEUMONIA vaccine once before and once after age 51 years  GOALS:  * Your Hemoglobin A1c of <7%  * fasting sugars need to be <130 * after meals sugars need to be <180 (2h after you start eating) * Your Systolic BP should be 140 or lower  * Your Diastolic BP should be 80 or lower  * Your HDL (Good Cholesterol) should be 40 or higher  * Your LDL (Bad Cholesterol) should be 100 or lower. * Your Triglycerides should be 150 or lower  * Your Urine microalbumin (kidney function) should be <30 * Your Body Mass  Index should be 25 or lower    Please consider the following ways to cut down carbs and fat and increase fiber and micronutrients in your diet: - substitute whole grain for white bread or pasta - substitute brown rice for white rice - substitute 90-calorie flat bread pieces for slices of bread when possible - substitute sweet potatoes or yams for white potatoes - substitute humus for margarine - substitute tofu for cheese when possible - substitute almond or rice milk for regular milk  (would not drink soy milk daily due to concern for soy estrogen influence on breast cancer risk) - substitute dark chocolate for other sweets when possible - substitute water - can add lemon or orange slices for taste - for diet sodas (artificial sweeteners will trick your body that you can eat sweets without getting calories and will lead you to overeating and weight gain in the long run) - do not skip breakfast or other meals (this will slow down the metabolism and will result in more weight gain over time)  - can try smoothies made from fruit and almond/rice milk in am instead of regular breakfast - can also try old-fashioned (not instant) oatmeal made with almond/rice milk in am - order the dressing on the side when eating salad at a restaurant (pour less than half of the dressing on the salad) - eat as little meat as possible - can try juicing, but should not forget that juicing will get rid of the fiber, so would alternate with eating raw veg./fruits or drinking smoothies - use as little oil as possible, even when using olive oil - can dress a salad with a mix of balsamic vinegar and lemon juice, for e.g. - use agave nectar, stevia sugar, or regular sugar rather than artificial sweateners - steam or broil/roast veggies  - snack on veggies/fruit/nuts (unsalted, preferably) when possible, rather than processed foods - reduce or eliminate aspartame in diet (it is in diet sodas, chewing gum, etc) Read the labels!  Try to read Dr. Katherina Right book: "Program for Reversing Diabetes" for other ideas for healthy eating.

## 2018-03-30 NOTE — Progress Notes (Signed)
Patient ID: Gershon Cullravon W Pospisil, male   DOB: November 26, 1995, 23 y.o.   MRN: [id number]   HPI: Gershon Cullravon W Hourihan is a 23 y.o.-year-old male, referred by Lanae BoastKc, Ramesh, MD, for management of DM2, dx in 03/2018, insulin-dependent, uncontrolled, without long-term complications, but with DKA. He is here with his mother who offers part of the history, especially relating to his diet and recent admission  Patient was diagnosed with diabetes 5 days ago, when he presented to the emergency room with fatigue, increased thirst and urination and nausea and a CBG >600.  He was also found to be in DKA and was admitted.  He was discharged on basal-bolus insulin regimen.  However, he tells me that he could not afford his long-acting insulin and only takes his rapid acting insulin.   No available HbA1c level: No results found for: HGBA1C  Pt is on a regimen of: - Humalog 4 units 3x a day, after meals He was also Rx'ed Lantus 24 units at bedtime >> could not afford!  Pt checks his sugars 3-4x a day and they are: - am: 200, 225 - 2h after b'fast: n/c - before lunch: 190 - 2h after lunch: n/c - before dinner: 200 - 2h after dinner: 190-225 - bedtime: n/c - nighttime: n/c Lowest sugar was 190; ?hypoglycemia awareness. Highest sugar was >600.  Glucometer: One Touch Verio  Pt's meals are: - Breakfast: eggs + fruit - Lunch: chicken + green beans - Dinner: fish/chicken + veggies + occasional starches - Snacks: water or sugar free drinks Before his dx >> a lot of apple juice.  - no CKD, last BUN/creatinine:  Lab Results  Component Value Date   BUN 8 03/28/2018   BUN 9 03/27/2018   CREATININE 1.09 03/28/2018   CREATININE 1.23 03/27/2018  On losartan 50.  - last set of lipids: No results found for: CHOL, HDL, LDLCALC, LDLDIRECT, TRIG, CHOLHDL  - last eye exam was long time ago  - no numbness and tingling in his feet.  Pt has [facility] of [facility], M great aunt, 2nd cousin.  He has a h/o HTN.  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  ROS: Constitutional: no weight gain, + weight loss ( however, weight gain per our scale), no fatigue, no subjective hyperthermia, no subjective hypothermia, no nocturia, + decreased appetite Eyes: no blurry vision, no xerophthalmia ENT: no sore throat, no nodules palpated in neck, no dysphagia, no odynophagia, no hoarseness, no tinnitus, no hypoacusis Cardiovascular: no CP, no SOB, no palpitations, no leg swelling Respiratory: no cough, no SOB, no wheezing Gastrointestinal: no N, no V, no D, no C, no acid reflux Musculoskeletal: no muscle, no joint aches Skin: no rash, no hair loss Neurological: no tremors, no numbness or tingling/no dizziness/no HAs Psychiatric: no depression, no anxiety  No past medical history on file.  -Except as above  No past surgical history on file.   Social History   Socioeconomic History  . Marital status: Single    Spouse name: Not on file  . Number of children: 0  . Years of education: Not on file  . Highest education level: Not on file  Occupational History  .  Cook  Social Needs  . Financial resource strain: Not on file  . Food insecurity:    Worry: Not on file    Inability: Not on file  . Transportation needs:    Medical: Not on file    Non-medical: Not on file  Tobacco Use  . Smoking status:  Never Smoker  . Smokeless tobacco:  Vaping  Substance and Sexual Activity  . Alcohol use: No  . Drug use: No   Current Outpatient Medications on File Prior to Visit  Medication Sig Dispense Refill  . 14.4 mL 0  . insulin lispro (HUMALOG KWIKPEN) 100 UNIT/ML KwikPen Inject 0.04 mLs (4 Units total) into the skin 3 (three) times daily after meals for 30 days. 3.6 mL 0  . Insulin Pen Needle 31G X 5 MM MISC Please along with Basaglar flex pen BID and humalog kwik pen TID w meal. 100 each 0  . losartan-hydrochlorothiazide (HYZAAR) 50-12.5 MG tablet Take 1 tablet by mouth daily.     No current facility-administered medications on file prior to  visit.    No Known Allergies Family History  Problem Relation Age of Onset  . Cancer Other   . Diabetes Other   . Sudden death Neg Hx   . Heart attack Neg Hx   Also please see HPI.  PE: BP 128/90   Pulse 86   Ht 6\' 1"  (1.854 m)   Wt 275 lb (124.7 kg)   SpO2 98%   BMI 36.28 kg/m  Wt Readings from Last 3 Encounters:  03/30/18 275 lb (124.7 kg)  03/27/18 271 lb 12.8 oz (123.3 kg)  12/08/14 259 lb 2 oz (117.5 kg) (>99 %, Z= 2.65)*   * Growth percentiles are based on CDC (Boys, 2-20 Years) data.   Constitutional: overweight, in NAD Eyes: PERRLA, EOMI, no exophthalmos ENT: moist mucous membranes, no thyromegaly, no cervical lymphadenopathy Cardiovascular: RRR, No MRG Respiratory: CTA B Gastrointestinal: abdomen soft, NT, ND, BS+ Musculoskeletal: no deformities, strength intact in all 4 Skin: moist, warm, no rashes Neurological: no tremor with outstretched hands, DTR normal in all 4  ASSESSMENT: 1. DM2, insulin-dependent, uncontrolled, without long-term complications, but with DKA  PLAN:  1. Patient with new diagnosis of diabetes, possibly triggered by his juice intake and eating large portions of food.  He changed this since diagnosis.  He is not drinking juice anymore and reduce his portions -He is on only rapid-acting insulin regimen, started at diagnosis.  He could not afford Lantus.  He only has pen of Humalog.  They are making efforts to afford Humalog.  -At today's visit, we checked an HbA1c and this was 12%. -We discussed about the fact that he may have type I or type 2 diabetes, but it is impossible to make the distinction for now.  I plan to check his insulin production at next visit, when his sugars improved.  For now, he is too glucotoxic to do so.  We are now treating him as type I. -We also discussed that his sugars, although I suspect improved, are still high, in the 200s mainly.  There is no pattern of increased throughout the day.  Therefore, it appears that he  definitely needs long-acting insulin.  Evaristo Bury appears to be covered for him so today I gave him a sample pen and also called this into his pharmacy.  We discussed about how to increase the dose if sugars do not improve on the starting dose.  -Regarding Humalog we discussed about moving the injection doses before, rather than after meals.  I advised him to do so 15 minutes inject each meal.  I also advised him to keep the pen in use out of the fridge. I also refilled his Humalog. -I gave him a more flexible regimen depending on the size of his meal -We will also  refer him to nutrition.  I do this much diabetes education as I pointed this visit.  We discussed about short and long-term complications to include hypoglycemia, hyperglycemia, and also explained the risk for eye, kidney, nerve damage and also the high cardiovascular risk with diabetes. -We also discussed about the healthy diet.  I made specific suggestions and also given them written instructions. - I suggested to:  Patient Instructions  Please start Tresiba 18 units daily.  If the sugars are >140 in am, then increase Tresiba by 4 units.  Please inject Humalog 15 min before each meal: - 4 units before a smaller meal - 6 units before a regular meal - 8 units before a larger meal or if you have dessert  Please schedule an appt with Oran Rein with nutrition.  Please call and schedule an eye appt with Dr. Randon Goldsmith: [facility]:  Dr. Jeni Salles MD ?  Address: [address] [place], [place], [place] [address]  [phone]  - continue checking sugars at different times of the day - check 3x a day, rotating checks - discussed about CBG targets for treatment: 80-130 mg/dL before meals and <325 mg/dL after meals; target QDI2M <7%. - given sugar log and advised how to fill it and to bring it at next appt  - given foot care handout and explained the principles  - given instructions for hypoglycemia management "15-15  rule"  - advised for yearly eye exams  - Return to clinic in 1.5 mo with sugar log   Carlus Pavlov, MD PhD [facility]

## 2018-03-30 NOTE — Addendum Note (Signed)
Addended by: Darliss Ridgel I on: 03/30/2018 03:58 PM   Modules accepted: Orders

## 2018-04-30 ENCOUNTER — Other Ambulatory Visit: Payer: Self-pay

## 2018-04-30 ENCOUNTER — Encounter: Payer: 59 | Attending: Internal Medicine | Admitting: Dietician

## 2018-04-30 ENCOUNTER — Encounter: Payer: Self-pay | Admitting: Dietician

## 2018-04-30 DIAGNOSIS — E1110 Type 2 diabetes mellitus with ketoacidosis without coma: Secondary | ICD-10-CM | POA: Diagnosis not present

## 2018-04-30 NOTE — Patient Instructions (Signed)
Choose foods that are baked rather than fried most often. Add exercise most days.   Consider a small snack before exercise if your blood sugar is less than 120.  Continue to check your blood sugar as directed. Continue to take your insulin as prescribed. Great job on the changes that you have made!

## 2018-04-30 NOTE — Progress Notes (Signed)
Diabetes Self-Management Education  Visit Type: First/Initial  Appt. Start Time: 1540 Appt. End Time: 1710  04/30/2018  [name], identified by name and date of birth, is a 23 y.o. male with a diagnosis of Diabetes: (unknown).   Patient is here today with his mother.  He is newly diagnosed with diabetes (unknown type at this time).  He also has newly diagnosed HTN. A1C 12% 03/2018 Medications include Evaristo Bury 18 units (takes around lunch time), Humalog 4 units for a small meal, 6 for medium meal, 8 for a large meal (before meals) Patient states that he is taking insulin without problems  Patient lives with his his mother and 35 yo brother. 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 Mom does the shopping and cooking.  ASSESSMENT  Height 6\' 1"  (1.854 m), weight 279 lb (126.6 kg). Body mass index is 36.81 kg/m.  Diabetes Self-Management Education - 04/30/18 1602      Visit Information   Visit Type  First/Initial      Initial Visit   Diabetes Type  --   unknown   Are you currently following a meal plan?  No    Are you taking your medications as prescribed?  Yes    Date Diagnosed  03/2018      Health Coping   How would you rate your overall health?  Fair      Psychosocial Assessment   Patient Belief/Attitude about Diabetes  Motivated to manage diabetes    Self-care barriers  None    Self-management support  Doctor's office;Family    Other persons present  Patient;Family Member    Patient Concerns  Nutrition/Meal planning;Glycemic Control;Healthy Lifestyle    Special Needs  None    Preferred Learning Style  No preference indicated    Learning Readiness  Ready    How often do you need to have someone help you when you read instructions, pamphlets, or other written materials from your doctor or pharmacy?  1 - Never    What is the last grade level you completed in school?  12th grade      Pre-Education Assessment   Patient understands the  diabetes disease and treatment process.  Needs Instruction    Patient understands incorporating nutritional management into lifestyle.  Needs Instruction    Patient undertands incorporating physical activity into lifestyle.  Needs Instruction    Patient understands using medications safely.  Needs Instruction    Patient understands monitoring blood glucose, interpreting and using results  Needs Instruction    Patient understands prevention, detection, and treatment of acute complications.  Needs Instruction    Patient understands prevention, detection, and treatment of chronic complications.  Needs Instruction    Patient understands how to develop strategies to address psychosocial issues.  Needs Instruction    Patient understands how to develop strategies to promote health/change behavior.  Needs Instruction      Complications   Last HgB A1C per patient/outside source  12 %   03/30/2018   How often do you check your blood sugar?  3-4 times/day    Fasting Blood glucose range (mg/dL)  13-143    Postprandial Blood glucose range (mg/dL)  88-875;797-282   06-015   Number of hypoglycemic episodes per month  0    Number of hyperglycemic episodes per week  0    Have you had a dilated eye exam in the past 12 months?  No    Have you had a  dental exam in the past 12 months?  Yes    Are you checking your feet?  No      Dietary Intake   Breakfast  eggs, bacon, white or [employer] toast (1), occasional fruit   7-8   Snack (morning)  none    Lunch  chicken, salad, fruit, starch   12-2   Snack (afternoon)  none    Dinner  similar to dinner    Snack (evening)  none    Beverage(s)  water, sugar free drinks      Exercise   Exercise Type  ADL's   states that he wants to start     Patient Education   Previous Diabetes Education  Yes (please comment)   by MD in February   Disease state   Definition of diabetes, type 1 and 2, and the diagnosis of diabetes;Factors that contribute to the development of  diabetes    Nutrition management   Food label reading, portion sizes and measuring food.;Role of diet in the treatment of diabetes and the relationship between the three main macronutrients and blood glucose level;Carbohydrate counting;Information on hints to eating out and maintain blood glucose control.;Meal options for control of blood glucose level and chronic complications.    Physical activity and exercise   Role of exercise on diabetes management, blood pressure control and cardiac health.;Identified with patient nutritional and/or medication changes necessary with exercise.    Medications  Taught/reviewed insulin injection, site rotation, insulin storage and needle disposal.;Reviewed medication adjustment guidelines for hyperglycemia and sick days.    Monitoring  Purpose and frequency of SMBG.;Identified appropriate SMBG and/or A1C goals.;Yearly dilated eye exam;Daily foot exams    Acute complications  Taught treatment of hypoglycemia - the 15 rule.;Discussed and identified patients' treatment of hyperglycemia.    Chronic complications  Relationship between chronic complications and blood glucose control    Psychosocial adjustment  Role of stress on diabetes      Individualized Goals (developed by patient)   Nutrition  General guidelines for healthy choices and portions discussed    Physical Activity  Exercise 5-7 days per week;30 minutes per day    Medications  take my medication as prescribed    Monitoring   test my blood glucose as discussed    Reducing Risk  examine blood glucose patterns;increase portions of healthy fats    Health Coping  discuss diabetes with (comment)   MD, RD     Post-Education Assessment   Patient understands the diabetes disease and treatment process.  Demonstrates understanding / competency    Patient understands incorporating nutritional management into lifestyle.  Demonstrates understanding / competency    Patient undertands incorporating physical activity  into lifestyle.  Demonstrates understanding / competency    Patient understands using medications safely.  Demonstrates understanding / competency    Patient understands monitoring blood glucose, interpreting and using results  Demonstrates understanding / competency    Patient understands prevention, detection, and treatment of acute complications.  Demonstrates understanding / competency    Patient understands prevention, detection, and treatment of chronic complications.  Demonstrates understanding / competency    Patient understands how to develop strategies to address psychosocial issues.  Demonstrates understanding / competency    Patient understands how to develop strategies to promote health/change behavior.  Demonstrates understanding / competency      Outcomes   Expected Outcomes  Demonstrated interest in learning. Expect positive outcomes    Future DMSE  PRN    Program Status  Completed  Individualized Plan for Diabetes Self-Management Training:   Learning Objective:  Patient will have a greater understanding of diabetes self-management. Patient education plan is to attend individual and/or group sessions per assessed needs and concerns.   Plan:   Patient Instructions  Choose foods that are baked rather than fried most often. Add exercise most days.   Consider a small snack before exercise if your blood sugar is less than 120.  Continue to check your blood sugar as directed. Continue to take your insulin as prescribed. Great job on the changes that you have made!    Expected Outcomes:  Demonstrated interest in learning. Expect positive outcomes  Education material provided: How to Thrive:  A Guide for Your Journey with Diabetes, Food label handouts, Meal plan card and Snack sheet, eating out tips, Diabetes Resources  If problems or questions, patient to contact team via:  Phone  Future DSME appointment: PRN

## 2018-05-01 ENCOUNTER — Telehealth: Payer: Self-pay

## 2018-05-01 NOTE — Telephone Encounter (Signed)
-----   Message from Carlus Pavlov, MD sent at 04/30/2018  4:51 PM EDT ----- Regarding: RE: work release letter M, Can you please write this? 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    Thanks, Vernona Rieger

## 2018-05-01 NOTE — Telephone Encounter (Signed)
    Patient would like the letter faxed to his employer at [employer] [phone]  Thanks,  Vernona Rieger

## 2018-05-01 NOTE — Telephone Encounter (Signed)
Letter faxed as requested

## 2018-05-10 ENCOUNTER — Other Ambulatory Visit: Payer: Self-pay

## 2018-05-11 ENCOUNTER — Telehealth: Payer: Self-pay | Admitting: Internal Medicine

## 2018-05-11 ENCOUNTER — Encounter: Payer: Self-pay | Admitting: Internal Medicine

## 2018-05-11 ENCOUNTER — Ambulatory Visit (INDEPENDENT_AMBULATORY_CARE_PROVIDER_SITE_OTHER): Payer: 59 | Admitting: Internal Medicine

## 2018-05-11 ENCOUNTER — Other Ambulatory Visit: Payer: Self-pay

## 2018-05-11 VITALS — BP 120/70 | HR 88 | Temp 98.7°F | Ht 73.0 in | Wt 274.0 lb

## 2018-05-11 DIAGNOSIS — E1110 Type 2 diabetes mellitus with ketoacidosis without coma: Secondary | ICD-10-CM

## 2018-05-11 LAB — LIPID PANEL
CHOLESTEROL: 211 mg/dL — AB (ref 0–200)
HDL: 25.7 mg/dL — ABNORMAL LOW (ref >39.00)
NONHDL: 185.5
Total CHOL/HDL Ratio: 8
Triglycerides: 227 mg/dL — ABNORMAL HIGH (ref 0.0–149.0)
VLDL: 45.4 mg/dL — ABNORMAL HIGH (ref 0.0–40.0)

## 2018-05-11 LAB — TSH: TSH: 1.48 u[IU]/mL (ref 0.35–5.50)

## 2018-05-11 LAB — LDL CHOLESTEROL, DIRECT: Direct LDL: 148 mg/dL

## 2018-05-11 NOTE — Telephone Encounter (Signed)
Patient came in stating that Terry Jensen was supposed to be faxing over a letter to his work after his visit. He is unsure about why it needed to be snet but his job did not receive it.   Please Advise, Thanks

## 2018-05-11 NOTE — Patient Instructions (Addendum)
Please continue: - Tresiba 18 units daily  For now, hold: - Humalog 15 min before each meal: - 4 units before a smaller meal - 6 units before a regular meal - 8 units before a larger meal or if you have dessert  Please call me with your sugars in 1 week to see if we need to restart the Humalog.   Please come back for a follow-up appointment in 3 months.

## 2018-05-11 NOTE — Progress Notes (Signed)
Patient ID: Terry Jensen, male   DOB: 17-Apr-1995, 23 y.o.   MRN: [id number]   HPI: Terry Jensen is a 23 y.o.-year-old male, initially referred by Dr. Dayna Barker, now returning for follow-up for DM, dx in 03/2018, insulin-dependent, uncontrolled, without long-term complications, but with DKA.  Last visit 2 months ago.  Reviewed history: Patient was diagnosed with diabetes 5 days before last visit, when he presented to the emergency room with fatigue, increased thirst and urination and nausea and a CBG >600.  He was also found to be in DKA and was admitted.  He was discharged on basal-bolus insulin regimen.  However, at last visit, he could not afford his long-acting insulin and was only taking his rapid acting insulin.   Reviewed HbA1c: Lab Results  Component Value Date   HGBA1C 12.0 (A) 03/30/2018   At last visit, he was on: - Humalog 4 units 3x a day, after meals He was also Rx'ed Lantus 24 units at bedtime >> could not afford!  We changed to: - Tresiba 18 units daily - Humalog 15 min before each meal >> ran out 2 days ago: - 4 units before a smaller meal - 6 units before a regular meal - 8 units before a larger meal or if you have dessert  Pt checks his sugars 3-4 times a day: - am: 200, 225 >> 90-120, (off Humalog): 98 - 2h after b'fast: n/c - before lunch: 190 >> 130-150 (juice), (off Humalog): 115 - 2h after lunch: n/c - before dinner: 200 >> 120-130, (off Humalog): 100 - 2h after dinner: 190-225 - bedtime: n/c - nighttime: n/c Lowest sugar was 190 >> 90; it is unclear at which level he has hypoglycemia awareness. Highest sugar was >600 >> 150.  Glucometer: One Touch Verio  Pt's meals are: - Breakfast: eggs + fruit - Lunch: chicken + green beans - Dinner: fish/chicken + veggies + occasional starches - Snacks: water or sugar free drinks Before his dx >> he was drinking a lot of apple juice.  He stopped afterwards, but now again drinking it occasionally.  -No CKD, last  BUN/creatinine:  Lab Results  Component Value Date   BUN 8 03/28/2018   BUN 9 03/27/2018   CREATININE 1.09 03/28/2018   CREATININE 1.23 03/27/2018  On losartan 50.  - last set of lipids: No results found for: CHOL, HDL, LDLCALC, LDLDIRECT, TRIG, CHOLHDL  - last eye exam was " long time ago".  He did not have time to schedule an appointment with Dr. Randon Goldsmith yet, as suggested at last visit.  -Denies numbness and tingling in his feet.  Pt has FH of DM in [facility], M great aunt, 2nd cousin.  He has a history of hypertension.  Latest TSH: Lab Results  Component Value Date   TSH 0.373 03/27/2018   He is a cook.  ROS: Constitutional: + Weight gain/no weight loss, no fatigue, no subjective hyperthermia, no subjective hypothermia Eyes: no blurry vision, no xerophthalmia ENT: no sore throat, no nodules palpated in neck, no dysphagia, no odynophagia, no hoarseness Cardiovascular: no CP/no SOB/no palpitations/no leg swelling Respiratory: no cough/no SOB/no wheezing Gastrointestinal: no N/no V/no D/no C/no acid reflux Musculoskeletal: no muscle aches/no joint aches Skin: no rashes, no hair loss Neurological: no tremors/no numbness/no tingling/no dizziness  I reviewed pt's medications, allergies, PMH, social hx, family hx, and changes were documented in the history of present illness. Otherwise, unchanged from my initial visit note.  Past Medical History:  Diagnosis Date  .  Diabetes mellitus without complication (HCC)   . Hypertension     -Except as above  No past surgical history on file.   Social History   Socioeconomic History  . Marital status: Single    Spouse name: Not on file  . Number of children: 0  . Years of education: Not on file  . Highest education level: Not on file  Occupational History  .  Cook  Social Needs  . Financial resource strain: Not on file  . Food insecurity:    Worry: Not on file    Inability: Not on file  . Transportation needs:    Medical:  Not on file    Non-medical: Not on file  Tobacco Use  . Smoking status: Never Smoker  . Smokeless tobacco:  Vaping  Substance and Sexual Activity  . Alcohol use: No  . Drug use: No   Current Outpatient Medications  Medication Sig Dispense Refill  . glucose blood (ONETOUCH VERIO) test strip Use as instructed 3x a day 300 each 3  . Insulin Degludec (TRESIBA FLEXTOUCH) 200 UNIT/ML SOPN Inject 18 Units into the skin daily. 3 pen 5  . insulin lispro (HUMALOG KWIKPEN) 100 UNIT/ML KwikPen Inject 0.04-0.08 mLs (4-8 Units total) into the skin 3 (three) times daily before meals. 15 mL 5  . Insulin Pen Needle 32G X 4 MM MISC Use 4x a day 300 each 3  . Lancets (ONETOUCH ULTRASOFT) lancets Use as instructed 3x a day 300 each 3  . losartan-hydrochlorothiazide (HYZAAR) 50-12.5 MG tablet Take 1 tablet by mouth daily.     No current facility-administered medications for this visit.     No Known Allergies Family History  Problem Relation Age of Onset  . Cancer Other   . Diabetes Other   . Sudden death Neg Hx   . Heart attack Neg Hx   Also please see HPI.  PE: BP 120/70   Pulse 88   Temp 98.7 F (37.1 C)   Ht 6\' 1"  (1.854 m)   Wt 274 lb (124.3 kg)   SpO2 98%   BMI 36.15 kg/m  Wt Readings from Last 3 Encounters:  05/11/18 274 lb (124.3 kg)  04/30/18 279 lb (126.6 kg)  03/30/18 275 lb (124.7 kg)   Constitutional: overweight, in NAD Eyes: PERRLA, EOMI, no exophthalmos ENT: moist mucous membranes, no thyromegaly, no cervical lymphadenopathy Cardiovascular: RRR, No MRG Respiratory: CTA B Gastrointestinal: abdomen soft, NT, ND, BS+ Musculoskeletal: no deformities, strength intact in all 4 Skin: moist, warm, no rashes Neurological: no tremor with outstretched hands, DTR normal in all 4  ASSESSMENT: 1. DM, insulin-dependent, uncontrolled, without long-term complications, but with DKA  2.  Obesity  PLAN:  1. Patient with history of recent diagnosis of diabetes, possibly triggered by  his juice intake and eating large portions of food.  He changed this since his diagnosis.  He stopped drinking juice and reduce his portions.  At last visit, 2 months ago, he was only taking rapid acting insulin, low doses, as he could not afford Lantus.  He was making an effort to afford Humalog.  His HbA1c was 12%.  At that time, we discussed that he may have type I or type 2 diabetes, but he could not check him for this as he was to glucotoxic at last visit.  As his sugars improved, we will check his insulin production and pancreatic antibodies.  Until then, we will treat him as he had type 1 diabetes.  At last visit,  we added long-acting insulin, in the form of [place]iba and we also started him on a more flexible insulin regimen with Humalog, depending on the size of his meals. At last visit, we also discussed at length about a healthy diet.  I made specific suggestions and also given them written instructions.  I also referred him to nutrition and he saw Oran ReinLaura Jobe since last visit. - at this visit, sugars are almost all at goal (even after running out of Humalog!) except for the CBGs before lunch (drinks juice before this >> strongly advised to stop).  -For now, since he has not been off Humalog for a long time, I advised him to 1 continue off for a week to see how the sugars change.  If they remain at goal, he does not need to add back the rapid acting insulin.  If they do increase, he absolutely needs to restart.  The Guinea-Bissa[place]iba dose appears appropriate, so we will not change this. -We will check annual labs today. - I suggested to:  Patient Instructions  Please continue: - Tresiba 18 units daily  For now, hold: - Humalog 15 min before each meal: - 4 units before a smaller meal - 6 units before a regular meal - 8 units before a larger meal or if you have dessert  Please call me with your sugars in 1 week to see if we need to restart the Humalog.   Please come back for a follow-up appointment in 3  months.  - continue checking sugars at different times of the day - check 3x a day, rotating checks - advised for yearly eye exams >> he is not UTD >> again suggested to see ophthalmology - We will check annual labs today - Return to clinic in 3 mo and I advised him to bring a sugar log or meter-we will check another HbA1c then.  2.  Obesity -did not lose a signif. Amt of weight since last OV  -Discussed about the need to stop juice  Component     Latest Ref Rng & Units 05/11/2018          Glucose     65 - 99 mg/dL 960101 (H)  BUN     7 - 25 mg/dL 13  Creatinine     4.540.60 - 1.35 mg/dL 0.980.97  GFR, Est Non African American     > OR = 60 mL/min/1.5773m2 112  GFR, Est African American     > OR = 60 mL/min/1.1673m2 130  BUN/Creatinine Ratio     6 - 22 (calc) NOT APPLICABLE  Sodium     135 - 146 mmol/L 139  Potassium     3.5 - 5.3 mmol/L 4.2  Chloride     98 - 110 mmol/L 107  CO2     20 - 32 mmol/L 25  Calcium     8.6 - 10.3 mg/dL 9.7  Total Protein     6.1 - 8.1 g/dL 6.9  Albumin MSPROF     3.6 - 5.1 g/dL 4.3  Globulin     1.9 - 3.7 g/dL (calc) 2.6  AG Ratio     1.0 - 2.5 (calc) 1.7  Total Bilirubin     0.2 - 1.2 mg/dL 0.3  Alkaline phosphatase (APISO)     36 - 130 U/L 56  AST     10 - 40 U/L 11  ALT     9 - 46 U/L 13  Cholesterol     0 -  200 mg/dL 628 (H)  Triglycerides     0.0 - 149.0 mg/dL 315.1 (H)  HDL Cholesterol     >39.00 mg/dL 76.16 (L)  VLDL     0.0 - 40.0 mg/dL 07.3 (H)  Total CHOL/HDL Ratio      8  NonHDL      185.50  Microalb, Ur     0.0 - 1.9 mg/dL 0.9  Creatinine,U     mg/dL 710.6  MICROALB/CREAT RATIO     0.0 - 30.0 mg/g 0.3  TSH     0.35 - 5.50 uIU/mL 1.48  Direct LDL     mg/dL 269.4   CMP, ACR, TSH -all normal.  Lipid panel with high triglycerides, LDL, and low HDL.  Improving his diabetes will most likely improve his cholesterol levels also.  I would like to recheck his lipid panel when HbA1c decreases further.  Carlus Pavlov, MD  PhD [facility] Endocrinology

## 2018-05-12 LAB — COMPLETE METABOLIC PANEL WITH GFR
AG Ratio: 1.7 (calc) (ref 1.0–2.5)
ALT: 13 U/L (ref 9–46)
AST: 11 U/L (ref 10–40)
Albumin: 4.3 g/dL (ref 3.6–5.1)
Alkaline phosphatase (APISO): 56 U/L (ref 36–130)
BUN: 13 mg/dL (ref 7–25)
CO2: 25 mmol/L (ref 20–32)
Calcium: 9.7 mg/dL (ref 8.6–10.3)
Chloride: 107 mmol/L (ref 98–110)
Creat: 0.97 mg/dL (ref 0.60–1.35)
GFR, EST NON AFRICAN AMERICAN: 112 mL/min/{1.73_m2} (ref >=60)
GFR, Est African American: 130 mL/min/{1.73_m2} (ref >=60)
Globulin: 2.6 g/dL (calc) (ref 1.9–3.7)
Glucose, Bld: 101 mg/dL — ABNORMAL HIGH (ref 65–99)
Potassium: 4.2 mmol/L (ref 3.5–5.3)
Sodium: 139 mmol/L (ref 135–146)
Total Bilirubin: 0.3 mg/dL (ref 0.2–1.2)
Total Protein: 6.9 g/dL (ref 6.1–8.1)

## 2018-05-14 ENCOUNTER — Telehealth: Payer: Self-pay | Admitting: Internal Medicine

## 2018-05-14 LAB — MICROALBUMIN / CREATININE URINE RATIO
Creatinine,U: 290.1 mg/dL
MICROALB/CREAT RATIO: 0.3 mg/g (ref 0.0–30.0)
Microalb, Ur: 0.9 mg/dL (ref 0.0–1.9)

## 2018-05-14 NOTE — Telephone Encounter (Signed)
Patient's Mom stated she is returning a call from our office. She is not sure what the call was for, she did not receive a voicemail

## 2018-05-14 NOTE — Telephone Encounter (Signed)
-----   Message from Bonnita Levan, RD sent at 05/13/2018  9:30 PM EDT ----- Regarding: FW: work release letter Thea Silversmith, I just noted a message from [place] (see his chart as her documentation is there).  Patient needed a letter so that he could return to work sent to his employer.  The employer has not received it yet.  I think that this was going to be done when he came in to see Dr. Reece Agar but I know things have been stressful. Could you send this. The fax number and site is below.  Thanks, :Vernona Rieger ----- Message ----- From: Bonnita Levan, RD Sent: 04/30/2018   5:02 PM EDT To: Derrill Center, CMA Subject: RE: work release letter                        Patient would like the letter faxed to his employer at [employer] [phone] Thanks, Vernona Rieger ----- Message ----- From: Carlus Pavlov, MD Sent: 04/30/2018   4:51 PM EDT To: Bonnita Levan, RD, Derrill Center, CMA Subject: RE: work release letter                        M, Can you please write this? 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    Thanks, Vernona Rieger

## 2018-05-14 NOTE — Telephone Encounter (Signed)
I have faxed this several times. I believe the fax number given is incorrect.  I tried calling patient at both numbers, one was disconnected and the other did not answer.

## 2018-05-16 NOTE — Telephone Encounter (Signed)
Tried calling home number, it rang several times then gave me a recording that the call could not be completed.  Called mobile number and left a VM.

## 2018-05-17 ENCOUNTER — Telehealth: Payer: Self-pay | Admitting: Dietician

## 2018-05-17 NOTE — Telephone Encounter (Signed)
Called and spoke to mom related to Terry Jensen's work release letter.  She is to stop by our office tomorrow to pick this up.  [name] has this ready.  Confirmed that the fax number we had been sending this letter to is accurate but the fax is not responding.    Oran Rein, RD, LDN, CDE

## 2018-05-17 NOTE — Telephone Encounter (Signed)
Patients mother just called stating that they will be coming by tomorrow just to pick up the letter.

## 2018-05-17 NOTE — Telephone Encounter (Signed)
-----   Message from Derrill Center, [place] sent at 05/15/2018  8:14 AM EDT ----- Regarding: RE: work release letter The fax number must be wrong because I faxed it several times and it keeps getting rejected. I tried calling patient but did not get a hold of him. ----- Message ----- From: Bonnita Levan, RD Sent: 05/14/2018   7:19 PM EDT To: Derrill Center, CMA Subject: RE: work release letter                        Thank you! ----- Message ----- From: Darliss Ridgel I, CMA Sent: 05/14/2018   8:07 AM EDT To: Bonnita Levan, RD, Marvia Pickles Subject: RE: work release letter                        I faxed this the same day. I do not know why they haven't gotten it but I can fax it again. ----- Message ----- From: Bonnita Levan, RD Sent: 05/13/2018   9:30 PM EDT To: Tim Lair, CMA Subject: FW: work release letter                        Thea Silversmith, I just noted a message from [place] (see his chart as her documentation is there).  Patient needed a letter so that he could return to work sent to his employer.  The employer has not received it yet.  I think that this was going to be done when he came in to see Dr. Reece Agar but I know things have been stressful. Could you send this. The fax number and site is below.  Thanks, :Vernona Rieger ----- Message ----- From: Bonnita Levan, RD Sent: 04/30/2018   5:02 PM EDT To: Derrill Center, CMA Subject: RE: work release letter                        Patient would like the letter faxed to his employer at [employer] [phone] Thanks, Vernona Rieger ----- Message ----- From: Carlus Pavlov, MD Sent: 04/30/2018   4:51 PM EDT To: Bonnita Levan, RD, Derrill Center, CMA Subject: RE: work release letter                        M, Can you please write this? 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    Thanks, Vernona Rieger

## 2018-05-18 ENCOUNTER — Encounter: Payer: Self-pay | Admitting: *Deleted

## 2018-05-18 ENCOUNTER — Telehealth: Payer: Self-pay | Admitting: Internal Medicine

## 2018-05-18 NOTE — Telephone Encounter (Signed)
Patient calling to get lab results from his 05/11/2018 visit I did advise that he could also view these on mychart

## 2018-05-18 NOTE — Telephone Encounter (Signed)
This encounter was created in error - please disregard.

## 2018-05-21 NOTE — Telephone Encounter (Signed)
Notes recorded by Carlus Pavlov, MD on 05/15/2018 at 3:05 PM EDT [name], can you please call pt: Terry Jensen, Terry Jensen, Terry Jensen -all normal. Lipid panel with high triglycerides and bad cholesterol (LDL), and low good cholesterol (HDL). Improving his diabetes will most likely improve his cholesterol levels also. I would like to recheck his lipid panel when HbA1c decreases further. In the meantime, please pay attention to the diet and reduce saturated fat and cholesterol.  Notified patient of message from Dr. Elvera Lennox, patient expressed understanding and agreement. No further questions.

## 2018-08-10 ENCOUNTER — Ambulatory Visit: Payer: 59 | Admitting: Internal Medicine

## 2019-10-24 ENCOUNTER — Other Ambulatory Visit: Payer: Self-pay

## 2019-10-24 ENCOUNTER — Emergency Department (HOSPITAL_COMMUNITY): Payer: 59

## 2019-10-24 ENCOUNTER — Encounter (HOSPITAL_COMMUNITY): Payer: Self-pay | Admitting: Emergency Medicine

## 2019-10-24 ENCOUNTER — Inpatient Hospital Stay (HOSPITAL_COMMUNITY)
Admission: EM | Admit: 2019-10-24 | Discharge: 2019-10-27 | DRG: 638 | Disposition: A | Discharge status: Home / Self Care | Payer: 59 | Attending: Internal Medicine | Admitting: Internal Medicine

## 2019-10-24 DIAGNOSIS — E1010 Type 1 diabetes mellitus with ketoacidosis without coma: Secondary | ICD-10-CM

## 2019-10-24 DIAGNOSIS — T383X6A Underdosing of insulin and oral hypoglycemic [antidiabetic] drugs, initial encounter: Secondary | ICD-10-CM | POA: Diagnosis present

## 2019-10-24 DIAGNOSIS — D72829 Elevated white blood cell count, unspecified: Secondary | ICD-10-CM | POA: Diagnosis present

## 2019-10-24 DIAGNOSIS — Z794 Long term (current) use of insulin: Secondary | ICD-10-CM

## 2019-10-24 DIAGNOSIS — R17 Unspecified jaundice: Secondary | ICD-10-CM | POA: Diagnosis present

## 2019-10-24 DIAGNOSIS — Z91128 Patient's intentional underdosing of medication regimen for other reason: Secondary | ICD-10-CM

## 2019-10-24 DIAGNOSIS — E1110 Type 2 diabetes mellitus with ketoacidosis without coma: Secondary | ICD-10-CM | POA: Diagnosis not present

## 2019-10-24 DIAGNOSIS — Z9114 Patient's other noncompliance with medication regimen: Secondary | ICD-10-CM

## 2019-10-24 DIAGNOSIS — Z833 Family history of diabetes mellitus: Secondary | ICD-10-CM

## 2019-10-24 DIAGNOSIS — Z20822 Contact with and (suspected) exposure to covid-19: Secondary | ICD-10-CM | POA: Diagnosis present

## 2019-10-24 DIAGNOSIS — I10 Essential (primary) hypertension: Secondary | ICD-10-CM | POA: Diagnosis present

## 2019-10-24 DIAGNOSIS — N179 Acute kidney failure, unspecified: Secondary | ICD-10-CM | POA: Diagnosis present

## 2019-10-24 DIAGNOSIS — Z79899 Other long term (current) drug therapy: Secondary | ICD-10-CM

## 2019-10-24 LAB — BASIC METABOLIC PANEL
Anion gap: 22 — ABNORMAL HIGH (ref 5–15)
BUN: 15 mg/dL (ref 6–20)
CO2: 8 mmol/L — ABNORMAL LOW (ref 22–32)
Calcium: 9.4 mg/dL (ref 8.9–10.3)
Chloride: 105 mmol/L (ref 98–111)
Creatinine, Ser: 1.65 mg/dL — ABNORMAL HIGH (ref 0.61–1.24)
GFR calc Af Amer: >60 mL/min (ref >60)
GFR calc non Af Amer: 58 mL/min — ABNORMAL LOW (ref >60)
Glucose, Bld: 363 mg/dL — ABNORMAL HIGH (ref 70–99)
Potassium: 4.5 mmol/L (ref 3.5–5.1)
Sodium: 135 mmol/L (ref 135–145)

## 2019-10-24 LAB — COMPREHENSIVE METABOLIC PANEL
ALT: 14 U/L (ref 0–44)
AST: 10 U/L — ABNORMAL LOW (ref 15–41)
Albumin: 4.6 g/dL (ref 3.5–5.0)
Alkaline Phosphatase: 108 U/L (ref 38–126)
Anion gap: 22 — ABNORMAL HIGH (ref 5–15)
BUN: 13 mg/dL (ref 6–20)
CO2: 7 mmol/L — ABNORMAL LOW (ref 22–32)
Calcium: 10.1 mg/dL (ref 8.9–10.3)
Chloride: 105 mmol/L (ref 98–111)
Creatinine, Ser: 1.72 mg/dL — ABNORMAL HIGH (ref 0.61–1.24)
GFR calc Af Amer: >60 mL/min (ref >60)
GFR calc non Af Amer: 55 mL/min — ABNORMAL LOW (ref >60)
Glucose, Bld: 414 mg/dL — ABNORMAL HIGH (ref 70–99)
Potassium: 4.1 mmol/L (ref 3.5–5.1)
Sodium: 134 mmol/L — ABNORMAL LOW (ref 135–145)
Total Bilirubin: 1.9 mg/dL — ABNORMAL HIGH (ref 0.3–1.2)
Total Protein: 8.6 g/dL — ABNORMAL HIGH (ref 6.5–8.1)

## 2019-10-24 LAB — CBC
HCT: 51.1 % (ref 39.0–52.0)
Hemoglobin: 17.3 g/dL — ABNORMAL HIGH (ref 13.0–17.0)
MCH: 29.8 pg (ref 26.0–34.0)
MCHC: 33.9 g/dL (ref 30.0–36.0)
MCV: 88 fL (ref 80.0–100.0)
Platelets: 176 10*3/uL (ref 150–400)
RBC: 5.81 MIL/uL (ref 4.22–5.81)
RDW: 14.3 % (ref 11.5–15.5)
WBC: 11.4 10*3/uL — ABNORMAL HIGH (ref 4.0–10.5)
nRBC: 0 % (ref 0.0–0.2)

## 2019-10-24 LAB — URINALYSIS, ROUTINE W REFLEX MICROSCOPIC
Bilirubin Urine: NEGATIVE
Glucose, UA: >=500 mg/dL — AB
Ketones, ur: 80 mg/dL — AB
Leukocytes,Ua: NEGATIVE
Nitrite: NEGATIVE
Protein, ur: 100 mg/dL — AB
Specific Gravity, Urine: 1.023 (ref 1.005–1.030)
pH: 5 (ref 5.0–8.0)

## 2019-10-24 LAB — I-STAT VENOUS BLOOD GAS, ED
Acid-base deficit: 19 mmol/L — ABNORMAL HIGH (ref 0.0–2.0)
Bicarbonate: 7.7 mmol/L — ABNORMAL LOW (ref 20.0–28.0)
Calcium, Ion: 1.2 mmol/L (ref 1.15–1.40)
HCT: 46 % (ref 39.0–52.0)
Hemoglobin: 15.6 g/dL (ref 13.0–17.0)
O2 Saturation: 55 %
Potassium: 4.3 mmol/L (ref 3.5–5.1)
Sodium: 136 mmol/L (ref 135–145)
TCO2: 8 mmol/L — ABNORMAL LOW (ref 22–32)
pCO2, Ven: 22 mmHg — ABNORMAL LOW (ref 44.0–60.0)
pH, Ven: 7.149 — CL (ref 7.250–7.430)
pO2, Ven: 36 mmHg (ref 32.0–45.0)

## 2019-10-24 LAB — CBG MONITORING, ED
Glucose-Capillary: 245 mg/dL — ABNORMAL HIGH (ref 70–99)
Glucose-Capillary: 259 mg/dL — ABNORMAL HIGH (ref 70–99)
Glucose-Capillary: 309 mg/dL — ABNORMAL HIGH (ref 70–99)
Glucose-Capillary: 357 mg/dL — ABNORMAL HIGH (ref 70–99)
Glucose-Capillary: 396 mg/dL — ABNORMAL HIGH (ref 70–99)

## 2019-10-24 LAB — HEMOGLOBIN A1C
Hgb A1c MFr Bld: 12.2 % — ABNORMAL HIGH (ref 4.8–5.6)
Mean Plasma Glucose: 303.44 mg/dL

## 2019-10-24 LAB — LIPASE, BLOOD: Lipase: 47 U/L (ref 11–51)

## 2019-10-24 LAB — LACTIC ACID, PLASMA: Lactic Acid, Venous: 1.9 mmol/L (ref 0.5–1.9)

## 2019-10-24 LAB — SARS CORONAVIRUS 2 BY RT PCR (HOSPITAL ORDER, PERFORMED IN ~~LOC~~ HOSPITAL LAB): SARS Coronavirus 2: NEGATIVE

## 2019-10-24 MED ORDER — LACTATED RINGERS IV BOLUS
2000.0000 mL | Freq: Once | INTRAVENOUS | Status: AC
  Administered 2019-10-24: 2000 mL via INTRAVENOUS

## 2019-10-24 MED ORDER — LACTATED RINGERS IV SOLN: INTRAVENOUS | Status: DC

## 2019-10-24 MED ORDER — DEXTROSE 50 % IV SOLN: 0.0000 mL | INTRAVENOUS | Status: DC | PRN

## 2019-10-24 MED ORDER — POTASSIUM CHLORIDE 10 MEQ/100ML IV SOLN: 10.0000 meq | INTRAVENOUS | Status: DC

## 2019-10-24 MED ORDER — INSULIN REGULAR(HUMAN) IN NACL 100-0.9 UT/100ML-% IV SOLN
INTRAVENOUS | Status: DC
  Administered 2019-10-24: 10.5 [IU]/h via INTRAVENOUS
  Administered 2019-10-25: 1.9 [IU]/h via INTRAVENOUS
  Administered 2019-10-25: 10.5 [IU]/h via INTRAVENOUS
  Administered 2019-10-25: 12 [IU]/h via INTRAVENOUS
  Administered 2019-10-25: 10 [IU]/h via INTRAVENOUS
  Administered 2019-10-26: 1.7 [IU]/h via INTRAVENOUS
  Administered 2019-10-26: 4.2 [IU]/h via INTRAVENOUS
  Administered 2019-10-26: 5 [IU]/h via INTRAVENOUS
  Administered 2019-10-26: 3.6 [IU]/h via INTRAVENOUS
  Administered 2019-10-26: 3 [IU]/h via INTRAVENOUS
  Administered 2019-10-26: 4 [IU]/h via INTRAVENOUS
  Filled 2019-10-24 (×3): qty 100

## 2019-10-24 MED ORDER — DEXTROSE IN LACTATED RINGERS 5 % IV SOLN: INTRAVENOUS | Status: DC

## 2019-10-24 MED ORDER — INSULIN ASPART 100 UNIT/ML ~~LOC~~ SOLN: 10.0000 [IU] | Freq: Once | SUBCUTANEOUS | Status: DC

## 2019-10-24 MED ORDER — HEPARIN SODIUM (PORCINE) 5000 UNIT/ML IJ SOLN
5000.0000 [IU] | Freq: Three times a day (TID) | INTRAMUSCULAR | Status: DC
  Administered 2019-10-24 – 2019-10-26 (×7): 5000 [IU] via SUBCUTANEOUS
  Filled 2019-10-24 (×7): qty 1

## 2019-10-24 MED ORDER — SENNOSIDES-DOCUSATE SODIUM 8.6-50 MG PO TABS
1.0000 | ORAL_TABLET | Freq: Two times a day (BID) | ORAL | Status: DC
  Filled 2019-10-24 (×4): qty 1

## 2019-10-24 MED ORDER — PANTOPRAZOLE SODIUM 40 MG PO TBEC
40.0000 mg | DELAYED_RELEASE_TABLET | Freq: Every day | ORAL | Status: DC
  Administered 2019-10-24: 40 mg via ORAL
  Filled 2019-10-24: qty 1

## 2019-10-24 MED ORDER — INSULIN REGULAR(HUMAN) IN NACL 100-0.9 UT/100ML-% IV SOLN: INTRAVENOUS | Status: DC

## 2019-10-24 MED ORDER — ONDANSETRON HCL 4 MG/2ML IJ SOLN: 4.0000 mg | Freq: Four times a day (QID) | INTRAMUSCULAR | Status: DC | PRN

## 2019-10-24 MED ORDER — ACETAMINOPHEN 325 MG PO TABS: 650.0000 mg | ORAL_TABLET | Freq: Four times a day (QID) | ORAL | Status: DC | PRN

## 2019-10-24 MED ORDER — SODIUM CHLORIDE 0.9 % IV BOLUS
1000.0000 mL | Freq: Once | INTRAVENOUS | Status: AC
  Administered 2019-10-24: 1000 mL via INTRAVENOUS

## 2019-10-24 MED ORDER — POTASSIUM CHLORIDE 10 MEQ/100ML IV SOLN
10.0000 meq | INTRAVENOUS | Status: AC
  Administered 2019-10-24 (×2): 10 meq via INTRAVENOUS
  Filled 2019-10-24 (×2): qty 100

## 2019-10-24 MED ORDER — LACTATED RINGERS IV BOLUS: 20.0000 mL/kg | Freq: Once | INTRAVENOUS | Status: DC

## 2019-10-24 NOTE — Discharge Planning (Signed)
RNCM following for disposition needs.  Will benefit from Diabetic Navigator and outpatient resources.   Maguadalupe Lata J. Lucretia Roers, RN, BSN, [place] [phone]

## 2019-10-24 NOTE — ED Triage Notes (Signed)
Pt reports vomiting and SOB with exertion x 3 days.  Denies abd pain but states abd "feels tired" while urinating.  Tachycardic.

## 2019-10-24 NOTE — H&P (Signed)
[facility] History and Physical   Patient: Terry Jensen FTD:[id number]   PCP: Carlus Pavlov, MD DOB: [birth year]   DOA: 10/24/2019   DOS: 10/24/2019   DOS: the patient was seen and examined on 10/24/2019  Patient coming from: The patient is coming from Home  Chief Complaint: Shortness of breath and fatigue.  HPI: Terry Jensen is a 24 y.o. 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 Patient presented with complaints of fatigue and tiredness associated with shortness of breath on exertion ongoing for last few days. He also has 4 days of nausea and vomiting. He reports that when he is trying to urinate he feels " weak". He denies any chest pain or abdominal pain. He denies any nausea at the time of my evaluation. Denies any diarrhea and reports that he is rather constipated. He is passing gas. No swelling in the leg. No alcohol abuse no drug abuse. No smoking history. Patient was referred to endocrinology in 2020 but stopped seeing them after second visit. As he felt his sugars were remaining under control without the medication. Patient stop using short acting insulin almost 1 year ago and long-acting insulin after a few months of that. 4 months ago he noted that his sugars were running up again and therefore he started using long-acting insulin. He ran out of that insulin prescription 1 month ago.  ED Course: Presents with above complaint.  Work-up shows anion gap acidosis with hyperglycemia.  Patient received 3 L bolus and was started on IV insulin protocol and was referred for admission.  Review of Systems: as mentioned in the history of present illness.  All other systems reviewed and are negative.  Past Medical History:  Diagnosis Date  . Diabetes mellitus without complication (HCC)   . Hypertension    History reviewed. No pertinent surgical history. Social History:  reports that he has never smoked. He has never used smokeless  tobacco. No history on file for alcohol use and drug use.  No Known Allergies  Family history reviewed and not pertinent Family History  Problem Relation Age of Onset  . Cancer Other   . Diabetes Other   . Sudden death Neg Hx   . Heart attack Neg Hx      Prior to Admission medications   Medication Sig Start Date End Date Taking? Authorizing Provider  glucose blood (ONETOUCH VERIO) test strip Use as instructed 3x a day Patient not taking: Reported on 10/24/2019 03/30/18   Carlus Pavlov, MD  Insulin Degludec (TRESIBA FLEXTOUCH) 200 UNIT/ML SOPN Inject 18 Units into the skin daily. Patient not taking: Reported on 10/24/2019 03/30/18   Carlus Pavlov, MD  Insulin Pen Needle 32G X 4 MM MISC Use 4x a day Patient not taking: Reported on 10/24/2019 03/30/18   Carlus Pavlov, MD  Lancets [facility] ULTRASOFT) lancets Use as instructed 3x a day Patient not taking: Reported on 10/24/2019 03/30/18   Carlus Pavlov, MD    Physical Exam: Vitals:   10/24/19 1353 10/24/19 1627 10/24/19 1805 10/24/19 2300  BP: 105/79 116/83 122/87 126/77  Pulse: (!) 123 (!) 116 (!) 116 (!) 109  Resp: 20 16 18  (!) 22  Temp: (!) 97.4 F (36.3 C)  98 F (36.7 C)   TempSrc:   Oral   SpO2: 100% 99% 100% 100%  Weight:      Height:        General: alert and oriented to time, place, and person. Appear in mild distress,  affect appropriate Eyes: PERRL, Conjunctiva normal ENT: Oral Mucosa Clear, moist  Neck: no JVD, no Abnormal Mass Or lumps Cardiovascular: S1 and S2 Present, no Murmur, peripheral pulses symmetrical Respiratory: good respiratory effort, Bilateral Air entry equal and Decreased, no signs of accessory muscle use, Clear to Auscultation, no Crackles, no wheezes Abdomen: Bowel Sound present, Soft and no tenderness, no hernia Skin: no rashes no Extremities: no Pedal edema, no calf tenderness Neurologic: without any new focal findings Gait not checked due to patient safety concerns  Data  Reviewed: I have personally reviewed and interpreted labs, imaging as discussed below.  CBC: Recent Labs  Lab 10/24/19 1102 10/24/19 2039  WBC 11.4*  --   HGB 17.3* 15.6  HCT 51.1 46.0  MCV 88.0  --   PLT 176  --    Basic Metabolic Panel: Recent Labs  Lab 10/24/19 1102 10/24/19 2029 10/24/19 2039  NA 134* 135 136  K 4.1 4.5 4.3  CL 105 105  --   CO2 7* 8*  --   GLUCOSE 414* 363*  --   BUN 13 15  --   CREATININE 1.72* 1.65*  --   CALCIUM 10.1 9.4  --    GFR: Estimated Creatinine Clearance: 97.6 mL/min (A) (by C-G formula based on SCr of 1.65 mg/dL (H)). Liver Function Tests: Recent Labs  Lab 10/24/19 1102  AST 10*  ALT 14  ALKPHOS 108  BILITOT 1.9*  PROT 8.6*  ALBUMIN 4.6   Recent Labs  Lab 10/24/19 1102  LIPASE 47   No results for input(s): AMMONIA in the last 168 hours. Coagulation Profile: No results for input(s): INR, PROTIME in the last 168 hours. Cardiac Enzymes: No results for input(s): CKTOTAL, CKMB, CKMBINDEX, TROPONINI in the last 168 hours. BNP (last 3 results) No results for input(s): PROBNP in the last 8760 hours. HbA1C: Recent Labs    10/24/19 2029  HGBA1C 12.2*   CBG: Recent Labs  Lab 10/24/19 1100 10/24/19 2039 10/24/19 2137 10/24/19 2242 10/24/19 2348  GLUCAP 396* 357* 309* 259* 245*   Lipid Profile: No results for input(s): CHOL, HDL, LDLCALC, TRIG, CHOLHDL, LDLDIRECT in the last 72 hours. Thyroid Function Tests: No results for input(s): TSH, T4TOTAL, FREET4, T3FREE, THYROIDAB in the last 72 hours. Anemia Panel: No results for input(s): VITAMINB12, FOLATE, FERRITIN, TIBC, IRON, RETICCTPCT in the last 72 hours. Urine analysis:    Component Value Date/Time   COLORURINE STRAW (A) 10/24/2019 2029   APPEARANCEUR CLEAR 10/24/2019 2029   LABSPEC 1.023 10/24/2019 2029   PHURINE 5.0 10/24/2019 2029   GLUCOSEU >=500 (A) 10/24/2019 2029   HGBUR SMALL (A) 10/24/2019 2029   BILIRUBINUR NEGATIVE 10/24/2019 2029   KETONESUR 80  (A) 10/24/2019 2029   PROTEINUR 100 (A) 10/24/2019 2029   UROBILINOGEN 0.2 12/08/2014 1445   NITRITE NEGATIVE 10/24/2019 2029   LEUKOCYTESUR NEGATIVE 10/24/2019 2029    Radiological Exams on Admission: DG Chest Portable 1 View  Result Date: 10/24/2019 CLINICAL DATA:  Shortness of breath.  Diabetes. EXAM: PORTABLE CHEST 1 VIEW COMPARISON:  03/25/2018 FINDINGS: 1104 hours. The lungs are clear without focal pneumonia, edema, pneumothorax or pleural effusion. The cardiopericardial silhouette is within normal limits for size. The visualized bony structures of the thorax show no acute abnormality. IMPRESSION: No active disease. Electronically Signed   By: Kennith Center M.D.   On: 10/24/2019 11:16    I reviewed all nursing notes, pharmacy notes, vitals, pertinent old records.  Assessment/Plan 1. DKA (diabetic ketoacidoses) (HCC)  Insulin-dependent diabetes  mellitus.  Likely type I. Type of the diabetes is still not verified. Patient stopped following up with endocrinologist as well as stop taking this medication. On presentation anion gap 22, bicarb 7, blood glucose 415, beta hydroxybutyric acid currently pending.  Urinalysis shows ketonuria. VBG shows pH of 7.149 PCO2 of 22. Consistent with DKA on presentation. Currently received 3 L of normal saline and LR bolus. We will continue with IV LR. Endo tool protocol with IV insulin ordered as well. Follow-up on BMP and anion gap. Patient will be seen by diabetes educator in the morning. Will require referral again to endocrinology as well as the prescription for long-acting and short-acting insulin.  2.  Acute kidney injury Secondary to DKA and polyuria. Continue IV fluids and monitor.  3.  Leukocytosis Stress reaction Monitor. No evidence of active infection.  4.  Hyperbilirubinemia. Likely due to DKA. Monitor.  Nutrition: NPO  DVT Prophylaxis: Subcutaneous Heparin   Advance goals of care discussion: Full code   Consults: none    Family Communication: no family was present at bedside, at the time of interview.   Disposition:  From: Home  Author: Lynden Oxford, MD [facility] 10/24/2019 11:50 PM   To reach On-call, see care teams to locate the attending and reach out to them via [email or web address]. If 7PM-7AM, please contact night-coverage If you still have difficulty reaching the attending provider, please page the [facility] (Director on Call) for [facility] on amion for assistance.

## 2019-10-24 NOTE — ED Notes (Signed)
Pt's mother Lorriane Shire will like to be contacted once pt is roomed [phone]

## 2019-10-24 NOTE — ED Provider Notes (Signed)
[facility] EMERGENCY DEPARTMENT Provider Note   CSN: [id number] Arrival date & time: 10/24/19  1043     History Chief Complaint  Patient presents with  . Emesis  . Shortness of Breath    Terry Jensen is a 24 y.o. male.  HPI   Adult male with a history of insulin-dependent diabetes, now without insulin for about 2 months presents with 4 days of weakness, nausea, vomiting, generalized malaise. No clear precipitant, but now over the past 4 days he has had progressive decline in his condition. Has had multiple episodes of vomiting, no diarrhea.  He has no persistent abdominal pain, though he describes nausea, unsettled sensation. No fever. Patient has been unable to tolerate substantial amounts of oral intake in an attempt to improve his condition.   Past Medical History:  Diagnosis Date  . Diabetes mellitus without complication (HCC)   . Hypertension     Patient Active Problem List   Diagnosis Date Noted  . Diabetes mellitus with ketoacidosis, uncontrolled (HCC) 03/30/2018  . DKA (diabetic ketoacidoses) (HCC) 03/25/2018  . Sports physical 09/14/2011    History reviewed. No pertinent surgical history.     Family History  Problem Relation Age of Onset  . Cancer Other   . Diabetes Other   . Sudden death Neg Hx   . Heart attack Neg Hx     Social History   Tobacco Use  . Smoking status: Never Smoker  . Smokeless tobacco: Never Used  Substance Use Topics  . Alcohol use: Not on file  . Drug use: Not on file    Home Medications Prior to Admission medications   Medication Sig Start Date End Date Taking? Authorizing Provider  glucose blood (ONETOUCH VERIO) test strip Use as instructed 3x a day 03/30/18   Carlus Pavlov, MD  Insulin Degludec (TRESIBA FLEXTOUCH) 200 UNIT/ML SOPN Inject 18 Units into the skin daily. 03/30/18   Carlus Pavlov, MD  insulin lispro (HUMALOG KWIKPEN) 100 UNIT/ML KwikPen Inject 0.04-0.08 mLs (4-8 Units total) into  the skin 3 (three) times daily before meals. 03/30/18   Carlus Pavlov, MD  Insulin Pen Needle 32G X 4 MM MISC Use 4x a day 03/30/18   Carlus Pavlov, MD  Lancets [facility] ULTRASOFT) lancets Use as instructed 3x a day 03/30/18   Carlus Pavlov, MD  losartan-hydrochlorothiazide (HYZAAR) 50-12.5 MG tablet Take 1 tablet by mouth daily. 03/24/18   [provider]    Allergies    Patient has no known allergies.  Review of Systems   Review of Systems  Constitutional:       Per HPI, otherwise negative  HENT:       Per HPI, otherwise negative  Respiratory:       Per HPI, otherwise negative  Cardiovascular:       Per HPI, otherwise negative  Gastrointestinal: Positive for nausea and vomiting.  Endocrine:       Negative aside from HPI  Genitourinary:       Neg aside from HPI   Musculoskeletal:       Per HPI, otherwise negative  Skin: Negative.   Neurological: Positive for weakness. Negative for syncope.    Physical Exam Updated Vital Signs BP 122/87 (BP Location: Left Arm)   Pulse (!) 116   Temp 98 F (36.7 C) (Oral)   Resp 18   Ht 6\' 2"  (1.88 m)   Wt 122.5 kg   SpO2 100%   BMI 34.67 kg/m   Physical Exam Vitals  and nursing note reviewed.  Constitutional:      General: He is not in acute distress.    Appearance: He is well-developed.  HENT:     Head: Normocephalic and atraumatic.  Eyes:     Conjunctiva/sclera: Conjunctivae normal.  Cardiovascular:     Rate and Rhythm: Regular rhythm. Tachycardia present.  Pulmonary:     Effort: Pulmonary effort is normal. No respiratory distress.     Breath sounds: No stridor.  Abdominal:     General: There is no distension.     Palpations: Abdomen is soft.     Tenderness: There is no abdominal tenderness. There is no guarding.  Skin:    General: Skin is warm and dry.  Neurological:     Mental Status: He is alert and oriented to person, place, and time.     ED Results / Procedures / Treatments   Labs (all labs  ordered are listed, but only abnormal results are displayed) Labs Reviewed  COMPREHENSIVE METABOLIC PANEL - Abnormal; Notable for the following components:      Result Value   Sodium 134 (*)    CO2 7 (*)    Glucose, Bld 414 (*)    Creatinine, Ser 1.72 (*)    Total Protein 8.6 (*)    AST 10 (*)    Total Bilirubin 1.9 (*)    GFR calc non Af Amer 55 (*)    Anion gap 22 (*)    All other components within normal limits  CBC - Abnormal; Notable for the following components:   WBC 11.4 (*)    Hemoglobin 17.3 (*)    All other components within normal limits  CBG MONITORING, ED - Abnormal; Notable for the following components:   Glucose-Capillary 396 (*)    All other components within normal limits  SARS CORONAVIRUS 2 BY RT PCR (HOSPITAL ORDER, PERFORMED IN [facility] LAB)  LIPASE, BLOOD  LACTIC ACID, PLASMA  URINALYSIS, ROUTINE W REFLEX MICROSCOPIC  BETA-HYDROXYBUTYRIC ACID  BETA-HYDROXYBUTYRIC ACID  BLOOD GAS, VENOUS    EKG EKG Interpretation  Date/Time:  Thursday October 24 2019 10:50:56 EDT Ventricular Rate:  160 PR Interval:  124 QRS Duration: 78 QT Interval:  298 QTC Calculation: 486 R Axis:   37 Text Interpretation: Sinus tachycardia Otherwise normal ECG Confirmed by Gerhard Munch [phone]) on 10/24/2019 6:00:30 PM   Radiology DG Chest Portable 1 View  Result Date: 10/24/2019 CLINICAL DATA:  Shortness of breath.  Diabetes. EXAM: PORTABLE CHEST 1 VIEW COMPARISON:  03/25/2018 FINDINGS: 1104 hours. The lungs are clear without focal pneumonia, edema, pneumothorax or pleural effusion. The cardiopericardial silhouette is within normal limits for size. The visualized bony structures of the thorax show no acute abnormality. IMPRESSION: No active disease. Electronically Signed   By: Kennith Center M.D.   On: 10/24/2019 11:16    Procedures Procedures (including critical care time)  Medications Ordered in ED Medications  insulin regular, human (MYXREDLIN) 100 units/  100 mL infusion (has no administration in time range)  lactated ringers infusion (has no administration in time range)  dextrose 5 % in lactated ringers infusion (has no administration in time range)  dextrose 50 % solution 0-50 mL (has no administration in time range)  potassium chloride 10 mEq in 100 mL IVPB (has no administration in time range)  sodium chloride 0.9 % bolus 1,000 mL (1,000 mLs Intravenous New Bag/Given 10/24/19 1823)  lactated ringers bolus 2,000 mL (2,000 mLs Intravenous New Bag/Given 10/24/19 1823)    ED  Course  I have reviewed the triage vital signs and the nursing notes.  Pertinent labs & imaging results that were available during my care of the patient were reviewed by me and considered in my medical decision making (see chart for details).  Adult male with history of insulin-dependent diabetes, now without insulin for months presents with nausea, vomiting, diarrhea, weakness.  Initial suspicion of hyperglycemia/DKA consistent with initial lab results with anion gap greater than 20, glucose greater than 400, and acute kidney injury. Patient required initiation of continuous fluids, insulin, for DKA versus nonketotic hyperosmolar state.   Update: Patient aware of initial findings, he is getting started on fluid resuscitation.  He is aware of need for admission, and on chart review is clear he had a similar presentation in February, 2020 requiring hospitalization for DKA.   Final Clinical Impression(s) / ED Diagnoses Final diagnoses:  Diabetic ketoacidosis without coma associated with type 1 diabetes mellitus (HCC)   CRITICAL CARE Performed by: Gerhard Munch Total critical care time: 35 minutes Critical care time was exclusive of separately billable procedures and treating other patients. Critical care was necessary to treat or prevent imminent or life-threatening deterioration. Critical care was time spent personally by me on the following activities: development of  treatment plan with patient and/or surrogate as well as nursing, discussions with consultants, evaluation of patient's response to treatment, examination of patient, obtaining history from patient or surrogate, ordering and performing treatments and interventions, ordering and review of laboratory studies, ordering and review of radiographic studies, pulse oximetry and re-evaluation of patient's condition.    Gerhard Munch, MD 10/24/19 2348

## 2019-10-25 DIAGNOSIS — N179 Acute kidney failure, unspecified: Secondary | ICD-10-CM | POA: Diagnosis present

## 2019-10-25 DIAGNOSIS — E1010 Type 1 diabetes mellitus with ketoacidosis without coma: Principal | ICD-10-CM

## 2019-10-25 DIAGNOSIS — Z20822 Contact with and (suspected) exposure to covid-19: Secondary | ICD-10-CM | POA: Diagnosis present

## 2019-10-25 DIAGNOSIS — Z833 Family history of diabetes mellitus: Secondary | ICD-10-CM | POA: Diagnosis not present

## 2019-10-25 DIAGNOSIS — R17 Unspecified jaundice: Secondary | ICD-10-CM | POA: Diagnosis present

## 2019-10-25 DIAGNOSIS — E1110 Type 2 diabetes mellitus with ketoacidosis without coma: Secondary | ICD-10-CM | POA: Diagnosis present

## 2019-10-25 DIAGNOSIS — D72829 Elevated white blood cell count, unspecified: Secondary | ICD-10-CM | POA: Diagnosis present

## 2019-10-25 DIAGNOSIS — Z91128 Patient's intentional underdosing of medication regimen for other reason: Secondary | ICD-10-CM | POA: Diagnosis not present

## 2019-10-25 DIAGNOSIS — Z9114 Patient's other noncompliance with medication regimen: Secondary | ICD-10-CM | POA: Diagnosis not present

## 2019-10-25 DIAGNOSIS — I10 Essential (primary) hypertension: Secondary | ICD-10-CM | POA: Diagnosis present

## 2019-10-25 DIAGNOSIS — Z794 Long term (current) use of insulin: Secondary | ICD-10-CM | POA: Diagnosis not present

## 2019-10-25 DIAGNOSIS — Z79899 Other long term (current) drug therapy: Secondary | ICD-10-CM | POA: Diagnosis not present

## 2019-10-25 DIAGNOSIS — T383X6A Underdosing of insulin and oral hypoglycemic [antidiabetic] drugs, initial encounter: Secondary | ICD-10-CM | POA: Diagnosis present

## 2019-10-25 LAB — BASIC METABOLIC PANEL
Anion gap: 18 — ABNORMAL HIGH (ref 5–15)
Anion gap: 17 — ABNORMAL HIGH (ref 5–15)
Anion gap: 11 (ref 5–15)
Anion gap: 12 (ref 5–15)
Anion gap: 11 (ref 5–15)
BUN: 14 mg/dL (ref 6–20)
BUN: 13 mg/dL (ref 6–20)
BUN: 13 mg/dL (ref 6–20)
BUN: 12 mg/dL (ref 6–20)
BUN: 10 mg/dL (ref 6–20)
CO2: 10 mmol/L — ABNORMAL LOW (ref 22–32)
CO2: 10 mmol/L — ABNORMAL LOW (ref 22–32)
CO2: 16 mmol/L — ABNORMAL LOW (ref 22–32)
CO2: 14 mmol/L — ABNORMAL LOW (ref 22–32)
CO2: 15 mmol/L — ABNORMAL LOW (ref 22–32)
Calcium: 9.3 mg/dL (ref 8.9–10.3)
Calcium: 9.4 mg/dL (ref 8.9–10.3)
Calcium: 8.7 mg/dL — ABNORMAL LOW (ref 8.9–10.3)
Calcium: 9.1 mg/dL (ref 8.9–10.3)
Calcium: 8.9 mg/dL (ref 8.9–10.3)
Chloride: 107 mmol/L (ref 98–111)
Chloride: 108 mmol/L (ref 98–111)
Chloride: 105 mmol/L (ref 98–111)
Chloride: 109 mmol/L (ref 98–111)
Chloride: 114 mmol/L — ABNORMAL HIGH (ref 98–111)
Creatinine, Ser: 1.49 mg/dL — ABNORMAL HIGH (ref 0.61–1.24)
Creatinine, Ser: 1.32 mg/dL — ABNORMAL HIGH (ref 0.61–1.24)
Creatinine, Ser: 1.29 mg/dL — ABNORMAL HIGH (ref 0.61–1.24)
Creatinine, Ser: 1.04 mg/dL (ref 0.61–1.24)
Creatinine, Ser: 1.04 mg/dL (ref 0.61–1.24)
GFR calc Af Amer: >60 mL/min (ref >60)
GFR calc Af Amer: >60 mL/min (ref >60)
GFR calc Af Amer: >60 mL/min (ref >60)
GFR calc Af Amer: >60 mL/min (ref >60)
GFR calc Af Amer: >60 mL/min (ref >60)
GFR calc non Af Amer: >60 mL/min (ref >60)
GFR calc non Af Amer: >60 mL/min (ref >60)
GFR calc non Af Amer: >60 mL/min (ref >60)
GFR calc non Af Amer: >60 mL/min (ref >60)
GFR calc non Af Amer: >60 mL/min (ref >60)
Glucose, Bld: 234 mg/dL — ABNORMAL HIGH (ref 70–99)
Glucose, Bld: 249 mg/dL — ABNORMAL HIGH (ref 70–99)
Glucose, Bld: 267 mg/dL — ABNORMAL HIGH (ref 70–99)
Glucose, Bld: 193 mg/dL — ABNORMAL HIGH (ref 70–99)
Glucose, Bld: 95 mg/dL (ref 70–99)
Potassium: 3.8 mmol/L (ref 3.5–5.1)
Potassium: 3.5 mmol/L (ref 3.5–5.1)
Potassium: 3.3 mmol/L — ABNORMAL LOW (ref 3.5–5.1)
Potassium: 3.5 mmol/L (ref 3.5–5.1)
Potassium: 3.3 mmol/L — ABNORMAL LOW (ref 3.5–5.1)
Sodium: 135 mmol/L (ref 135–145)
Sodium: 135 mmol/L (ref 135–145)
Sodium: 132 mmol/L — ABNORMAL LOW (ref 135–145)
Sodium: 135 mmol/L (ref 135–145)
Sodium: 140 mmol/L (ref 135–145)

## 2019-10-25 LAB — CBG MONITORING, ED
Glucose-Capillary: 178 mg/dL — ABNORMAL HIGH (ref 70–99)
Glucose-Capillary: 182 mg/dL — ABNORMAL HIGH (ref 70–99)
Glucose-Capillary: 193 mg/dL — ABNORMAL HIGH (ref 70–99)
Glucose-Capillary: 194 mg/dL — ABNORMAL HIGH (ref 70–99)
Glucose-Capillary: 203 mg/dL — ABNORMAL HIGH (ref 70–99)
Glucose-Capillary: 203 mg/dL — ABNORMAL HIGH (ref 70–99)
Glucose-Capillary: 203 mg/dL — ABNORMAL HIGH (ref 70–99)
Glucose-Capillary: 204 mg/dL — ABNORMAL HIGH (ref 70–99)
Glucose-Capillary: 209 mg/dL — ABNORMAL HIGH (ref 70–99)
Glucose-Capillary: 212 mg/dL — ABNORMAL HIGH (ref 70–99)
Glucose-Capillary: 218 mg/dL — ABNORMAL HIGH (ref 70–99)
Glucose-Capillary: 220 mg/dL — ABNORMAL HIGH (ref 70–99)
Glucose-Capillary: 254 mg/dL — ABNORMAL HIGH (ref 70–99)
Glucose-Capillary: 261 mg/dL — ABNORMAL HIGH (ref 70–99)

## 2019-10-25 LAB — GLUCOSE, CAPILLARY
Glucose-Capillary: 126 mg/dL — ABNORMAL HIGH (ref 70–99)
Glucose-Capillary: 181 mg/dL — ABNORMAL HIGH (ref 70–99)
Glucose-Capillary: 196 mg/dL — ABNORMAL HIGH (ref 70–99)
Glucose-Capillary: 227 mg/dL — ABNORMAL HIGH (ref 70–99)
Glucose-Capillary: 228 mg/dL — ABNORMAL HIGH (ref 70–99)
Glucose-Capillary: 85 mg/dL (ref 70–99)

## 2019-10-25 LAB — BETA-HYDROXYBUTYRIC ACID
Beta-Hydroxybutyric Acid: 5.1 mmol/L — ABNORMAL HIGH (ref 0.05–0.27)
Beta-Hydroxybutyric Acid: 4.31 mmol/L — ABNORMAL HIGH (ref 0.05–0.27)

## 2019-10-25 LAB — HIV ANTIBODY (ROUTINE TESTING W REFLEX): HIV Screen 4th Generation wRfx: NONREACTIVE

## 2019-10-25 MED ORDER — SODIUM CHLORIDE 0.9 % IV BOLUS
1000.0000 mL | Freq: Once | INTRAVENOUS | Status: AC
  Administered 2019-10-25: 1000 mL via INTRAVENOUS

## 2019-10-25 MED ORDER — POTASSIUM CHLORIDE CRYS ER 20 MEQ PO TBCR
20.0000 meq | EXTENDED_RELEASE_TABLET | Freq: Once | ORAL | Status: AC
  Administered 2019-10-25: 20 meq via ORAL
  Filled 2019-10-25: qty 1

## 2019-10-25 MED ORDER — POTASSIUM CHLORIDE CRYS ER 20 MEQ PO TBCR
40.0000 meq | EXTENDED_RELEASE_TABLET | Freq: Once | ORAL | Status: AC
  Administered 2019-10-25: 40 meq via ORAL
  Filled 2019-10-25: qty 2

## 2019-10-25 MED ORDER — LIVING WELL WITH DIABETES BOOK
Freq: Once | Status: AC
  Filled 2019-10-25: qty 1

## 2019-10-25 MED ORDER — PANTOPRAZOLE SODIUM 40 MG IV SOLR
40.0000 mg | Freq: Two times a day (BID) | INTRAVENOUS | Status: DC
  Administered 2019-10-25 – 2019-10-27 (×5): 40 mg via INTRAVENOUS
  Filled 2019-10-25 (×5): qty 40

## 2019-10-25 MED ORDER — POTASSIUM CHLORIDE 10 MEQ/100ML IV SOLN
10.0000 meq | INTRAVENOUS | Status: AC
  Administered 2019-10-25 (×3): 10 meq via INTRAVENOUS
  Filled 2019-10-25 (×3): qty 100

## 2019-10-25 MED ORDER — SODIUM CHLORIDE 0.9 % IV BOLUS
500.0000 mL | Freq: Once | INTRAVENOUS | Status: AC
  Administered 2019-10-25: 500 mL via INTRAVENOUS

## 2019-10-25 MED ORDER — POTASSIUM CHLORIDE 10 MEQ/100ML IV SOLN
10.0000 meq | INTRAVENOUS | Status: AC
  Administered 2019-10-25 (×2): 10 meq via INTRAVENOUS
  Filled 2019-10-25 (×3): qty 100

## 2019-10-25 NOTE — Progress Notes (Addendum)
Inpatient Diabetes Program Recommendations  AACE/ADA: New Consensus Statement on Inpatient Glycemic Control (2015)  Target Ranges:  Prepandial:   less than 140 mg/dL      Peak postprandial:   less than 180 mg/dL (1-2 hours)      Critically ill patients:  140 - 180 mg/dL   Lab Results  Component Value Date   GLUCAP 203 (H) 10/25/2019   HGBA1C 12.2 (H) 10/24/2019    Review of Glycemic Control Results for DEQUON, SCHNEBLY (MRN [id number]) as of 10/25/2019 07:49  Ref. Range 10/24/2019 11:02  CO2 Latest Ref Range: 22 - 32 mmol/L 7 (L)  Results for ICHAEL, PULLARA (MRN [id number]) as of 10/25/2019 07:49  Ref. Range 10/24/2019 11:02  Anion gap Latest Ref Range: 5 - 15  22 (H)  Results for ARCHIE, [name] (MRN [id number]) as of 10/25/2019 07:49  Ref. Range 10/25/2019 05:35  Beta-Hydroxybutyric Acid Latest Ref Range: 0.05 - 0.27 mmol/L 5.10 (H)  Results for Terry Jensen, Terry Jensen (MRN [id number]) as of 10/25/2019 07:49  Ref. Range 10/24/2019 11:02  Glucose Latest Ref Range: 70 - 99 mg/dL 414 (H)   Diabetes history:  DM  Outpatient Diabetes medications:  None in approx. 1 year  Current orders for Inpatient glycemic control:  IV insulin  Inpatient Diabetes Program Recommendations:     When criteria is met and MD is ready to transition to subcutaneous insulin please consider,  Lantus 18 units 2 hrs prior to turning off IV insulin Novolog 0-15 units tid and 0-5 qhs  Addendum@ 1339: Spoke with patient and mom at bedside.  Patient states he started feeling bad on Tuesday.  He had not taken his Humalog over the last several months.  He states he has been taking [place] 18 units daily.  He denies difficulties obtaining medications.  He administers his [place] Aas into the back of his arms.  He was diagnosed with type 1 diabetes in 2019.  He sees Dr. [name] but has missed appointments recently.  Encouraged him to keep his scheduled appointments.  Explained pathophysiology of type 1 DM and that his pancreas does  not produce insulin. He must have long acting insulin and short acting insulin to cover correction and CHO's.   Explained DKA, importance of insulin administration, CHO's and CHO goals per meal.  Attached education to AVS and ordered LWWD booklet per mom request.  He will need refills at discharge for Humalog, Tresiba and pen needles.    Order numbers for DC:  Tresiba [id number] Humalog [id number] Pen [phone]  Will continue to follow while inpatient.  Thank you, Reche Dixon, RN, BSN Diabetes Coordinator Inpatient Diabetes Program [phone] (team pager from 8a-5p)

## 2019-10-25 NOTE — Progress Notes (Signed)
PROGRESS NOTE    Terry Jensen  FPO:[id number] DOB: 04-05-1995 DOA: 10/24/2019 PCP: [name], MD   Brief Narrative: 24 year old PMH significant for insulin-dependent diabetes type 1 who was not taking his insulin for the last month presented with DKA  Assessment & Plan:   Principal Problem:   DKA (diabetic ketoacidoses) ([place]) Active Problems:   AKI (acute kidney injury) ([place])   Hyperbilirubinemia   Leucocytosis  1-DKA, diabetes type 1: Presented with gap of 22, bicarb 7 blood sugar 415. Continue with insulin drip, IV fluids. Continue to check be met every 4 hours When bicarb more than 22 plan to transition to Lantus 24 units daily A1c 12.  2-AKI: Secondary to DKA and polyuria Improved with IV fluids  3-Leukocytosis. UA; Negative for infection. Chest x-ray negative  Hyperbilirubinemia Follow trend      Estimated body mass index is 34.67 kg/m as calculated from the following:   Height as of this encounter: 6' 2" (1.88 m).   Weight as of this encounter: 122.5 kg.   DVT prophylaxis: Heparin Code Status: Full code Family Communication: Care discussed with patient Disposition Plan:  Status is: Inpatient  Dispo: The patient is from: Home              Anticipated d/c is to: Home              Anticipated d/c date is: 2 days              Patient currently is not medically stable to d/c.        Consultants:   none  Procedures:   None  Antimicrobials:    Subjective: He is feeling ok, denies chest pain, dyspnea at this time  Objective: Vitals:   10/25/19 0500 10/25/19 0600 10/25/19 0700 10/25/19 0758  BP: 116/70 124/74 111/66 111/66  Pulse: 95 95 95 92  Resp: _0 Temp:      TempSrc:      SpO2: 99% 98% 100% 100%  Weight:      Height:        Intake/Output Summary (Last 24 hours) at 10/25/2019 0803 Last data filed at 10/24/2019 2243 Gross per 24 hour  Intake 3000 ml  Output --  Net 3000 ml   Filed Weights   10/24/19  1049  Weight: 122.5 kg    Examination:  General exam: Appears calm and comfortable  Respiratory system: Clear to auscultation. Respiratory effort normal. Cardiovascular system: S1 & S2 heard, RRR. No JVD, murmurs, rubs, gallops or clicks. No pedal edema. Gastrointestinal system: Abdomen is nondistended, soft and nontender. No organomegaly or masses felt. Normal bowel sounds heard. Central nervous system: Alert and oriented. No focal neurological deficits. Extremities: Symmetric 5 x 5 power. Skin: No rashes, lesions or ulcers   Data Reviewed: I have personally reviewed following labs and imaging studies  CBC: Recent Labs  Lab 10/24/19 1102 10/24/19 2039  WBC 11.4*  --   HGB 17.3* 15.6  HCT 51.1 46.0  MCV 88.0  --   PLT 176  --    Basic Metabolic Panel: Recent Labs  Lab 10/24/19 1102 10/24/19 2029 10/24/19 2039 10/25/19 0100 10/25/19 0535  NA 134* 135 136 135 135  K 4.1 4.5 4.3 3.8 3.5  CL 105 105  --  107 108  CO2 7* 8*  --  10* 10*  GLUCOSE 414* 363*  --  234* 249*  BUN 13 15  --  14 13  CREATININE  1.72* 1.65*  --  1.49* 1.32*  CALCIUM 10.1 9.4  --  9.3 9.4   GFR: Estimated Creatinine Clearance: 122 mL/min (A) (by C-G formula based on SCr of 1.32 mg/dL (H)). Liver Function Tests: Recent Labs  Lab 10/24/19 1102  AST 10*  ALT 14  ALKPHOS 108  BILITOT 1.9*  PROT 8.6*  ALBUMIN 4.6   Recent Labs  Lab 10/24/19 1102  LIPASE 47   No results for input(s): AMMONIA in the last 168 hours. Coagulation Profile: No results for input(s): INR, PROTIME in the last 168 hours. Cardiac Enzymes: No results for input(s): CKTOTAL, CKMB, CKMBINDEX, TROPONINI in the last 168 hours. BNP (last 3 results) No results for input(s): PROBNP in the last 8760 hours. HbA1C: Recent Labs    10/24/19 2029  HGBA1C 12.2*   CBG: Recent Labs  Lab 10/25/19 0321 10/25/19 0429 10/25/19 0534 10/25/19 0635 10/25/19 0753  GLUCAP 220* 203* 212* 203* 209*   Lipid Profile: No  results for input(s): CHOL, HDL, LDLCALC, TRIG, CHOLHDL, LDLDIRECT in the last 72 hours. Thyroid Function Tests: No results for input(s): TSH, T4TOTAL, FREET4, T3FREE, THYROIDAB in the last 72 hours. Anemia Panel: No results for input(s): VITAMINB12, FOLATE, FERRITIN, TIBC, IRON, RETICCTPCT in the last 72 hours. Sepsis Labs: Recent Labs  Lab 10/24/19 1102  LATICACIDVEN 1.9    Recent Results (from the past 240 hour(s))  SARS Coronavirus 2 by RT PCR (hospital order, performed in [facility] hospital lab) Nasopharyngeal Nasopharyngeal Swab     Status: None   Collection Time: 10/24/19 10:54 AM   Specimen: Nasopharyngeal Swab  Result Value Ref Range Status   SARS Coronavirus 2 NEGATIVE NEGATIVE Final    Comment: (NOTE) SARS-CoV-2 target nucleic acids are NOT DETECTED.  The SARS-CoV-2 RNA is generally detectable in upper and lower respiratory specimens during the acute phase of infection. The lowest concentration of SARS-CoV-2 viral copies this assay can detect is 250 copies / mL. A negative result does not preclude SARS-CoV-2 infection and should not be used as the sole basis for treatment or other patient management decisions.  A negative result may occur with improper specimen collection / handling, submission of specimen other than nasopharyngeal swab, presence of viral mutation(s) within the areas targeted by this assay, and inadequate number of viral copies (<250 copies / mL). A negative result must be combined with clinical observations, patient history, and epidemiological information.  Fact Sheet for Patients:   StrictlyIdeas.no  Fact Sheet for Healthcare Providers: BankingDealers.co.za  This test is not yet approved or  cleared by the [place] FDA and has been authorized for detection and/or diagnosis of SARS-CoV-2 by FDA under an Emergency Use Authorization (EUA).  This EUA will remain in effect (meaning this test can  be used) for the duration of the COVID-19 declaration under Section 564(b)(1) of the Act, 21 U.S.C. section 360bbb-3(b)(1), unless the authorization is terminated or revoked sooner.  Performed at [facility], [place] [address] [place], [place] [address]          Radiology Studies: DG Chest Portable 1 View  Result Date: 10/24/2019 CLINICAL DATA:  Shortness of breath.  Diabetes. EXAM: PORTABLE CHEST 1 VIEW COMPARISON:  03/25/2018 FINDINGS: 1104 hours. The lungs are clear without focal pneumonia, edema, pneumothorax or pleural effusion. The cardiopericardial silhouette is within normal limits for size. The visualized bony structures of the thorax show no acute abnormality. IMPRESSION: No active disease. Electronically Signed   By: Misty Stanley M.D.   On:  10/24/2019 11:16        Scheduled Meds: . heparin  5,000 Units Subcutaneous Q8H  . pantoprazole  40 mg Oral Daily  . potassium chloride  40 mEq Oral Once  . senna-docusate  1 tablet Oral BID   Continuous Infusions: . dextrose 5% lactated ringers 125 mL/hr at 10/25/19 0005  . insulin 5 Units/hr (10/25/19 0758)  . lactated ringers Stopped (10/25/19 0000)     LOS: 0 days    Time spent: 35 minutes    Francisca Langenderfer A Kamali Sakata, MD [facility]   If 7PM-7AM, please contact night-coverage [email or web address]  10/25/2019, 8:03 AM

## 2019-10-26 LAB — GLUCOSE, CAPILLARY
Glucose-Capillary: 115 mg/dL — ABNORMAL HIGH (ref 70–99)
Glucose-Capillary: 154 mg/dL — ABNORMAL HIGH (ref 70–99)
Glucose-Capillary: 168 mg/dL — ABNORMAL HIGH (ref 70–99)
Glucose-Capillary: 165 mg/dL — ABNORMAL HIGH (ref 70–99)
Glucose-Capillary: 179 mg/dL — ABNORMAL HIGH (ref 70–99)
Glucose-Capillary: 155 mg/dL — ABNORMAL HIGH (ref 70–99)
Glucose-Capillary: 152 mg/dL — ABNORMAL HIGH (ref 70–99)
Glucose-Capillary: 178 mg/dL — ABNORMAL HIGH (ref 70–99)
Glucose-Capillary: 150 mg/dL — ABNORMAL HIGH (ref 70–99)
Glucose-Capillary: 276 mg/dL — ABNORMAL HIGH (ref 70–99)
Glucose-Capillary: 249 mg/dL — ABNORMAL HIGH (ref 70–99)
Glucose-Capillary: 178 mg/dL — ABNORMAL HIGH (ref 70–99)
Glucose-Capillary: 153 mg/dL — ABNORMAL HIGH (ref 70–99)
Glucose-Capillary: 150 mg/dL — ABNORMAL HIGH (ref 70–99)
Glucose-Capillary: 147 mg/dL — ABNORMAL HIGH (ref 70–99)

## 2019-10-26 LAB — BASIC METABOLIC PANEL
Anion gap: 12 (ref 5–15)
Anion gap: 13 (ref 5–15)
Anion gap: 17 — ABNORMAL HIGH (ref 5–15)
Anion gap: 14 (ref 5–15)
BUN: 7 mg/dL (ref 6–20)
BUN: 5 mg/dL — ABNORMAL LOW (ref 6–20)
BUN: <5 mg/dL — ABNORMAL LOW (ref 6–20)
BUN: <5 mg/dL — ABNORMAL LOW (ref 6–20)
CO2: 14 mmol/L — ABNORMAL LOW (ref 22–32)
CO2: 16 mmol/L — ABNORMAL LOW (ref 22–32)
CO2: 17 mmol/L — ABNORMAL LOW (ref 22–32)
CO2: 20 mmol/L — ABNORMAL LOW (ref 22–32)
Calcium: 8.5 mg/dL — ABNORMAL LOW (ref 8.9–10.3)
Calcium: 8.7 mg/dL — ABNORMAL LOW (ref 8.9–10.3)
Calcium: 8.8 mg/dL — ABNORMAL LOW (ref 8.9–10.3)
Calcium: 8.8 mg/dL — ABNORMAL LOW (ref 8.9–10.3)
Chloride: 111 mmol/L (ref 98–111)
Chloride: 109 mmol/L (ref 98–111)
Chloride: 105 mmol/L (ref 98–111)
Chloride: 102 mmol/L (ref 98–111)
Creatinine, Ser: 1.11 mg/dL (ref 0.61–1.24)
Creatinine, Ser: 0.94 mg/dL (ref 0.61–1.24)
Creatinine, Ser: 0.9 mg/dL (ref 0.61–1.24)
Creatinine, Ser: 0.93 mg/dL (ref 0.61–1.24)
GFR calc Af Amer: >60 mL/min (ref >60)
GFR calc Af Amer: >60 mL/min (ref >60)
GFR calc Af Amer: >60 mL/min (ref >60)
GFR calc Af Amer: >60 mL/min (ref >60)
GFR calc non Af Amer: >60 mL/min (ref >60)
GFR calc non Af Amer: >60 mL/min (ref >60)
GFR calc non Af Amer: >60 mL/min (ref >60)
GFR calc non Af Amer: >60 mL/min (ref >60)
Glucose, Bld: 181 mg/dL — ABNORMAL HIGH (ref 70–99)
Glucose, Bld: 172 mg/dL — ABNORMAL HIGH (ref 70–99)
Glucose, Bld: 155 mg/dL — ABNORMAL HIGH (ref 70–99)
Glucose, Bld: 186 mg/dL — ABNORMAL HIGH (ref 70–99)
Potassium: 3.3 mmol/L — ABNORMAL LOW (ref 3.5–5.1)
Potassium: 3.1 mmol/L — ABNORMAL LOW (ref 3.5–5.1)
Potassium: 2.9 mmol/L — ABNORMAL LOW (ref 3.5–5.1)
Potassium: 3 mmol/L — ABNORMAL LOW (ref 3.5–5.1)
Sodium: 137 mmol/L (ref 135–145)
Sodium: 138 mmol/L (ref 135–145)
Sodium: 139 mmol/L (ref 135–145)
Sodium: 136 mmol/L (ref 135–145)

## 2019-10-26 LAB — CBC
HCT: 34.9 % — ABNORMAL LOW (ref 39.0–52.0)
Hemoglobin: 12.5 g/dL — ABNORMAL LOW (ref 13.0–17.0)
MCH: 30.2 pg (ref 26.0–34.0)
MCHC: 35.8 g/dL (ref 30.0–36.0)
MCV: 84.3 fL (ref 80.0–100.0)
Platelets: 86 10*3/uL — ABNORMAL LOW (ref 150–400)
RBC: 4.14 MIL/uL — ABNORMAL LOW (ref 4.22–5.81)
RDW: 13.8 % (ref 11.5–15.5)
WBC: 6.5 10*3/uL (ref 4.0–10.5)
nRBC: 0 % (ref 0.0–0.2)

## 2019-10-26 LAB — MRSA PCR SCREENING: MRSA by PCR: NEGATIVE

## 2019-10-26 LAB — BETA-HYDROXYBUTYRIC ACID
Beta-Hydroxybutyric Acid: 3.82 mmol/L — ABNORMAL HIGH (ref 0.05–0.27)
Beta-Hydroxybutyric Acid: 5.2 mmol/L — ABNORMAL HIGH (ref 0.05–0.27)

## 2019-10-26 MED ORDER — INSULIN GLARGINE 100 UNIT/ML ~~LOC~~ SOLN
26.0000 [IU] | Freq: Every day | SUBCUTANEOUS | Status: DC
  Administered 2019-10-26 – 2019-10-27 (×2): 26 [IU] via SUBCUTANEOUS
  Filled 2019-10-26 (×2): qty 0.26

## 2019-10-26 MED ORDER — INSULIN ASPART 100 UNIT/ML ~~LOC~~ SOLN
0.0000 [IU] | Freq: Three times a day (TID) | SUBCUTANEOUS | Status: DC
  Administered 2019-10-27: 8 [IU] via SUBCUTANEOUS
  Administered 2019-10-27: 5 [IU] via SUBCUTANEOUS

## 2019-10-26 MED ORDER — INSULIN ASPART 100 UNIT/ML ~~LOC~~ SOLN
4.0000 [IU] | Freq: Three times a day (TID) | SUBCUTANEOUS | Status: DC
  Administered 2019-10-27 (×2): 4 [IU] via SUBCUTANEOUS

## 2019-10-26 MED ORDER — POTASSIUM CHLORIDE CRYS ER 20 MEQ PO TBCR
20.0000 meq | EXTENDED_RELEASE_TABLET | Freq: Once | ORAL | Status: AC
  Administered 2019-10-26: 20 meq via ORAL
  Filled 2019-10-26: qty 1

## 2019-10-26 MED ORDER — POTASSIUM CHLORIDE CRYS ER 20 MEQ PO TBCR
40.0000 meq | EXTENDED_RELEASE_TABLET | Freq: Once | ORAL | Status: AC
  Administered 2019-10-26: 40 meq via ORAL
  Filled 2019-10-26: qty 2

## 2019-10-26 MED ORDER — POTASSIUM CHLORIDE CRYS ER 20 MEQ PO TBCR
40.0000 meq | EXTENDED_RELEASE_TABLET | Freq: Once | ORAL | Status: AC
  Administered 2019-10-26: 40 meq via ORAL

## 2019-10-26 MED ORDER — INSULIN ASPART 100 UNIT/ML ~~LOC~~ SOLN: 0.0000 [IU] | Freq: Every day | SUBCUTANEOUS | Status: DC

## 2019-10-26 MED ORDER — POTASSIUM CHLORIDE 10 MEQ/100ML IV SOLN
10.0000 meq | INTRAVENOUS | Status: AC
  Administered 2019-10-26 (×4): 10 meq via INTRAVENOUS
  Filled 2019-10-26 (×3): qty 100

## 2019-10-26 MED ORDER — POTASSIUM CHLORIDE 10 MEQ/100ML IV SOLN
10.0000 meq | INTRAVENOUS | Status: AC
  Administered 2019-10-26 (×3): 10 meq via INTRAVENOUS
  Filled 2019-10-26 (×4): qty 100

## 2019-10-26 MED ORDER — POTASSIUM CHLORIDE 10 MEQ/100ML IV SOLN
10.0000 meq | INTRAVENOUS | Status: AC
  Administered 2019-10-26 (×4): 10 meq via INTRAVENOUS
  Filled 2019-10-26 (×2): qty 100

## 2019-10-26 MED ORDER — INSULIN GLARGINE 100 UNIT/ML ~~LOC~~ SOLN
26.0000 [IU] | Freq: Every day | SUBCUTANEOUS | Status: DC
  Filled 2019-10-26: qty 0.26

## 2019-10-26 NOTE — Significant Event (Signed)
HOSPITAL MEDICINE OVERNIGHT EVENT NOTE    Notified by nursing patient's last BS was 85, with ENDOTOOL prompting them to hold the insulin infusion.  Labs reviewed, gap still open.  I advised resumption of the insulin infusion once the BS rises and continuation of the LR infusion.  Patient also noted to be hypokalemic at 3.3, Potassium chloride IV ordered over 4 hrs.  Marinda Elk  MD [facility]

## 2019-10-26 NOTE — Progress Notes (Signed)
Received call from Dr Sunnie Nielsen regarding pt insulin drip and low potassium level, new orders to be received.  2015 Insulin drip increased to 10.5 units/hr per Endotool, Potassium Chloride was started and NS bolus infusing at this time.  2359 BS 85, Endotool recommended to stop insulin drip at this time, notified Physician on call for further direction.  0040 New orders received from Dr Leafy Half, Potassium ordered x 4 doses, D5LR resumed @ 140mL/hr after completion of NS bolus.  0330 Pt refused lab draw citing being tired of lab people digging around in his hand and he needed a break. Pt said he will cooperate with lab draw the next time they come around.  0422 BS 165-Endotool says glucose is stable and have the Provider consider transitioning the pt, rate change to 4 units/ hr, notified Dr Leafy Half.  59 Spoke with Dr Leafy Half who recommended to keep the insulin drip going until the next lab draw at 0900-same to be done.

## 2019-10-26 NOTE — Progress Notes (Signed)
PROGRESS NOTE    Terry Jensen  DUK:[id number] DOB: Nov 09, 1995 DOA: 10/24/2019 PCP: [name], MD   Brief Narrative: 24 year old PMH significant for insulin-dependent diabetes type 1 who was not taking his insulin for the last month presented with DKA  Assessment & Plan:   Principal Problem:   DKA (diabetic ketoacidoses) ([place]) Active Problems:   AKI (acute kidney injury) ([place])   Hyperbilirubinemia   Leucocytosis  1-DKA, diabetes type 1: Presented with gap of 22, bicarb 7 blood sugar 415. Continue with insulin drip, IV fluids. Continue to check be met every 4 hours When bicarb more than 22 plan to transition to Lantus 24 units daily A1c 12. Bicarb still low at 16. Continue with current plan.  B-hydroxybutyric acid elevated 3.8.   2-AKI: Secondary to DKA and polyuria Resolved with IV fluids.    3-Leukocytosis. UA; Negative for infection. Chest x-ray negative  Hyperbilirubinemia Follow trend      Estimated body mass index is 34.67 kg/m as calculated from the following:   Height as of this encounter: '6\' 2"'  (1.88 m).   Weight as of this encounter: 122.5 kg.   DVT prophylaxis: Heparin Code Status: Full code Family Communication: Care discussed with patient Disposition Plan:  Status is: Inpatient  Dispo: The patient is from: Home              Anticipated d/c is to: Home              Anticipated d/c date is: 2 days              Patient currently is not medically stable to d/c.        Consultants:   none  Procedures:   None  Antimicrobials:    Subjective: He does not want to be in the hospital, he converse at time, when he wants.  Denies cough, dysuria, chest pain   Objective: Vitals:   10/25/19 1700 10/25/19 1743 10/25/19 2328 10/26/19 0357  BP: 125/62  129/80 119/72  Pulse: 84  98 88  Resp: '16  12 17  ' Temp:  98.2 F (36.8 C) 98.8 F (37.1 C) 98.5 F (36.9 C)  TempSrc:   Oral Oral  SpO2: 100%  100% 100%  Weight:        Height:        Intake/Output Summary (Last 24 hours) at 10/26/2019 1114 Last data filed at 10/26/2019 0309 Gross per 24 hour  Intake 2909.73 ml  Output --  Net 2909.73 ml   Filed Weights   10/24/19 1049  Weight: 122.5 kg    Examination:  General exam: NAD Respiratory system: CTA Cardiovascular system: S 1, S 2 RRR Gastrointestinal system: BS present, soft, nt Central nervous system: alert, answer questions Extremities: no edema Skin: no rashes   Data Reviewed: I have personally reviewed following labs and imaging studies  CBC: Recent Labs  Lab 10/24/19 1102 10/24/19 2039 10/26/19 0826  WBC 11.4*  --  6.5  HGB 17.3* 15.6 12.5*  HCT 51.1 46.0 34.9*  MCV 88.0  --  84.3  PLT 176  --  86*   Basic Metabolic Panel: Recent Labs  Lab 10/25/19 1134 10/25/19 1850 10/25/19 2316 10/26/19 0458 10/26/19 0826  NA 132* 135 140 137 138  K 3.3* 3.5 3.3* 3.3* 3.1*  CL 105 109 114* 111 109  CO2 16* 14* 15* 14* 16*  GLUCOSE 267* 193* 95 181* 172*  BUN '13 12 10 7 ' 5*  CREATININE 1.29* 1.04  1.04 1.11 0.94  CALCIUM 8.7* 9.1 8.9 8.5* 8.7*   GFR: Estimated Creatinine Clearance: 171.4 mL/min (by C-G formula based on SCr of 0.94 mg/dL). Liver Function Tests: Recent Labs  Lab 10/24/19 1102  AST 10*  ALT 14  ALKPHOS 108  BILITOT 1.9*  PROT 8.6*  ALBUMIN 4.6   Recent Labs  Lab 10/24/19 1102  LIPASE 47   No results for input(s): AMMONIA in the last 168 hours. Coagulation Profile: No results for input(s): INR, PROTIME in the last 168 hours. Cardiac Enzymes: No results for input(s): CKTOTAL, CKMB, CKMBINDEX, TROPONINI in the last 168 hours. BNP (last 3 results) No results for input(s): PROBNP in the last 8760 hours. HbA1C: Recent Labs    10/24/19 2029  HGBA1C 12.2*   CBG: Recent Labs  Lab 10/26/19 0305 10/26/19 0402 10/26/19 0605 10/26/19 0835 10/26/19 0929  GLUCAP 168* 165* 179* 155* 152*   Lipid Profile: No results for input(s): CHOL, HDL, LDLCALC,  TRIG, CHOLHDL, LDLDIRECT in the last 72 hours. Thyroid Function Tests: No results for input(s): TSH, T4TOTAL, FREET4, T3FREE, THYROIDAB in the last 72 hours. Anemia Panel: No results for input(s): VITAMINB12, FOLATE, FERRITIN, TIBC, IRON, RETICCTPCT in the last 72 hours. Sepsis Labs: Recent Labs  Lab 10/24/19 1102  LATICACIDVEN 1.9    Recent Results (from the past 240 hour(s))  SARS Coronavirus 2 by RT PCR (hospital order, performed in [facility] hospital lab) Nasopharyngeal Nasopharyngeal Swab     Status: None   Collection Time: 10/24/19 10:54 AM   Specimen: Nasopharyngeal Swab  Result Value Ref Range Status   SARS Coronavirus 2 NEGATIVE NEGATIVE Final    Comment: (NOTE) SARS-CoV-2 target nucleic acids are NOT DETECTED.  The SARS-CoV-2 RNA is generally detectable in upper and lower respiratory specimens during the acute phase of infection. The lowest concentration of SARS-CoV-2 viral copies this assay can detect is 250 copies / mL. A negative result does not preclude SARS-CoV-2 infection and should not be used as the sole basis for treatment or other patient management decisions.  A negative result may occur with improper specimen collection / handling, submission of specimen other than nasopharyngeal swab, presence of viral mutation(s) within the areas targeted by this assay, and inadequate number of viral copies (<250 copies / mL). A negative result must be combined with clinical observations, patient history, and epidemiological information.  Fact Sheet for Patients:   StrictlyIdeas.no  Fact Sheet for Healthcare Providers: BankingDealers.co.za  This test is not yet approved or  cleared by the [place] FDA and has been authorized for detection and/or diagnosis of SARS-CoV-2 by FDA under an Emergency Use Authorization (EUA).  This EUA will remain in effect (meaning this test can be used) for the duration of the COVID-19  declaration under Section 564(b)(1) of the Act, 21 U.S.C. section 360bbb-3(b)(1), unless the authorization is terminated or revoked sooner.  Performed at [facility], [place] [address] [place], [place] [address]   MRSA PCR Screening     Status: None   Collection Time: 10/26/19  1:15 AM   Specimen: Nasopharyngeal  Result Value Ref Range Status   MRSA by PCR NEGATIVE NEGATIVE Final    Comment:        The GeneXpert MRSA Assay (FDA approved for NASAL specimens only), is one component of a comprehensive MRSA colonization surveillance program. It is not intended to diagnose MRSA infection nor to guide or monitor treatment for MRSA infections. Performed at [facility], [place] [address],  [place], [place] [address]          Radiology Studies: DG Chest Portable 1 View  Result Date: 10/24/2019 CLINICAL DATA:  Shortness of breath.  Diabetes. EXAM: PORTABLE CHEST 1 VIEW COMPARISON:  03/25/2018 FINDINGS: 1104 hours. The lungs are clear without focal pneumonia, edema, pneumothorax or pleural effusion. The cardiopericardial silhouette is within normal limits for size. The visualized bony structures of the thorax show no acute abnormality. IMPRESSION: No active disease. Electronically Signed   By: Misty Stanley M.D.   On: 10/24/2019 11:16        Scheduled Meds: . heparin  5,000 Units Subcutaneous Q8H  . pantoprazole (PROTONIX) IV  40 mg Intravenous Q12H  . potassium chloride  40 mEq Oral Once  . senna-docusate  1 tablet Oral BID   Continuous Infusions: . dextrose 5% lactated ringers 100 mL/hr at 10/26/19 0053  . insulin 5 Units/hr (10/26/19 0612)  . lactated ringers Stopped (10/25/19 0000)  . potassium chloride       LOS: 1 day    Time spent: 35 minutes    Taffy Delconte A Jahmarion Popoff, MD [facility]   If 7PM-7AM, please contact night-coverage [email or web address]  10/26/2019, 11:14 AM

## 2019-10-27 LAB — BASIC METABOLIC PANEL
Anion gap: 13 (ref 5–15)
BUN: <5 mg/dL — ABNORMAL LOW (ref 6–20)
CO2: 19 mmol/L — ABNORMAL LOW (ref 22–32)
Calcium: 8.9 mg/dL (ref 8.9–10.3)
Chloride: 105 mmol/L (ref 98–111)
Creatinine, Ser: 1.05 mg/dL (ref 0.61–1.24)
GFR calc Af Amer: >60 mL/min (ref >60)
GFR calc non Af Amer: >60 mL/min (ref >60)
Glucose, Bld: 260 mg/dL — ABNORMAL HIGH (ref 70–99)
Potassium: 3.2 mmol/L — ABNORMAL LOW (ref 3.5–5.1)
Sodium: 137 mmol/L (ref 135–145)

## 2019-10-27 LAB — GLUCOSE, CAPILLARY
Glucose-Capillary: 300 mg/dL — ABNORMAL HIGH (ref 70–99)
Glucose-Capillary: 221 mg/dL — ABNORMAL HIGH (ref 70–99)

## 2019-10-27 MED ORDER — INSULIN PEN NEEDLE 32G X 4 MM MISC: 3 refills | Status: DC

## 2019-10-27 MED ORDER — SODIUM CHLORIDE 0.9 % IV BOLUS
500.0000 mL | Freq: Once | INTRAVENOUS | Status: AC
  Administered 2019-10-27: 500 mL via INTRAVENOUS

## 2019-10-27 MED ORDER — TRESIBA FLEXTOUCH 200 UNIT/ML ~~LOC~~ SOPN: 20.0000 [IU] | PEN_INJECTOR | Freq: Every day | SUBCUTANEOUS | 3 refills | Status: DC

## 2019-10-27 MED ORDER — INSULIN ASPART 100 UNIT/ML ~~LOC~~ SOLN: 6.0000 [IU] | Freq: Three times a day (TID) | SUBCUTANEOUS | 11 refills | Status: DC

## 2019-10-27 MED ORDER — ONETOUCH ULTRASOFT LANCETS MISC: 3 refills | Status: DC

## 2019-10-27 MED ORDER — GLUCERNA SHAKE PO LIQD
237.0000 mL | Freq: Three times a day (TID) | ORAL | Status: DC
  Administered 2019-10-27: 237 mL via ORAL

## 2019-10-27 MED ORDER — POTASSIUM CHLORIDE CRYS ER 20 MEQ PO TBCR
40.0000 meq | EXTENDED_RELEASE_TABLET | Freq: Once | ORAL | Status: AC
  Administered 2019-10-27: 40 meq via ORAL
  Filled 2019-10-27: qty 2

## 2019-10-27 NOTE — Progress Notes (Signed)
Initial Nutrition Assessment  DOCUMENTATION CODES:   Not applicable  INTERVENTION:   Glucerna Shake po TID, each supplement provides 220 kcal and 10 grams of protein   NUTRITION DIAGNOSIS:   Inadequate oral intake related to decreased appetite as evidenced by meal completion < 25%, per patient/family report.  GOAL:   Patient will meet greater than or equal to 90% of their needs  MONITOR:   PO intake, Supplement acceptance, Labs, Weight trends  REASON FOR ASSESSMENT:   Malnutrition Screening Tool    ASSESSMENT:   24 yo male admitted with DKA due to not taking insulin x 1 month. PMH includes insulin-dependent DM, HTN  No recorded po intake; spoke with RN who indicates pt did not eat breakfast this AM but drank some juice.   Pt reports wt loss in past 2 weeks; this is likely related to poorly controlled DM with not taking insulin.   Weights appear relatively stable per weight encounters; current wt 122.5 kg. Pt weighed 124.3 kg  >1 year ago.   Labs: potassium 3.2 (L), CBGs 147-300  Meds: ss novolog, novolog with meals, lantus   Diet Order:   Diet Order            Diet Carb Modified Fluid consistency: Thin; Room service appropriate? Yes  Diet effective now                 EDUCATION NEEDS:   No education needs have been identified at this time  Skin:  Skin Assessment: Reviewed RN Assessment  Last BM:  9/11  Height:   Ht Readings from Last 1 Encounters:  10/24/19 6\' 2"  (1.88 m)    Weight:   Wt Readings from Last 1 Encounters:  10/24/19 122.5 kg    BMI:  Body mass index is 34.67 kg/m.  Estimated Nutritional Needs:   Kcal:  2200-2400 kcals  Protein:  125-140 g  Fluid:  >/= 2 L   12/24/19 MS, RDN, LDN, CNSC Registered Dietitian III Clinical Nutrition RD Pager and On-Call Pager Number Located in [place]

## 2019-10-27 NOTE — Progress Notes (Signed)
Lantus 26 units given at this time. Insulin drip to be discontinued in 2 hours as ordered.  2130 Insulin drip stopped at this time. Last BS-147.

## 2019-10-27 NOTE — Discharge Summary (Signed)
Physician Discharge Summary  Terry Jensen FXT:[id number] DOB: May 17, 1995 DOA: 10/24/2019  PCP: Terry Kingdom, MD  Admit date: 10/24/2019 Discharge date: 10/27/2019  Admitted From: Home  Disposition: Home   Recommendations for Outpatient Follow-up:  1. Follow up with PCP in 1-2 weeks 2. Please obtain BMP/CBC/LFT in one week 3. Follow up with endocrinologist for further adjustment of insulin regimen.   Discharge Condition: Stable.  CODE STATUS: Full Code Diet recommendation: Carb Modified   Brief/Interim Summary: 24 year old PMH significant for insulin-dependent diabetes type 1 who was not taking his insulin for the last month presented with DKA.  1-DKA, diabetes type 1: Presented with gap of 22, bicarb 7 blood sugar 415. Treated with insulin drip, IV fluids. KCL B-met was check every 4 hours. He required to be on insulin gtt for 48 hours.  A1c 12. Bicarb increase to 19--20, gap close. He was transition to lantus.  Plan to discharge on tresiba 20 units, Novolo meals coverage 6 units.   2-AKI: Secondary to DKA and polyuria Resolved with IV fluids.    3-Leukocytosis. UA; Negative for infection. Chest x-ray negative  Hyperbilirubinemia Follow trend  Discharge Diagnoses:  Principal Problem:   DKA (diabetic ketoacidoses) ([place]) Active Problems:   AKI (acute kidney injury) ([place])   Hyperbilirubinemia   Leucocytosis    Discharge Instructions  Discharge Instructions    Diet - low sodium heart healthy   Complete by: As directed    Increase activity slowly   Complete by: As directed      Allergies as of 10/27/2019   No Known Allergies     Medication List    TAKE these medications   glucose blood test strip Commonly known as: OneTouch Verio Use as instructed 3x a day   insulin aspart 100 UNIT/ML injection Commonly known as: novoLOG Inject 6 Units into the skin 3 (three) times daily with meals.   Insulin Pen Needle 32G X 4 MM Misc Use 4x a day    onetouch ultrasoft lancets Use as instructed 3x a day   Tresiba FlexTouch 200 UNIT/ML FlexTouch Pen Generic drug: insulin degludec Inject 20 Units into the skin daily. What changed: how much to take       No Known Allergies  Consultations:  None   Procedures/Studies: DG Chest Portable 1 View  Result Date: 10/24/2019 CLINICAL DATA:  Shortness of breath.  Diabetes. EXAM: PORTABLE CHEST 1 VIEW COMPARISON:  03/25/2018 FINDINGS: 1104 hours. The lungs are clear without focal pneumonia, edema, pneumothorax or pleural effusion. The cardiopericardial silhouette is within normal limits for size. The visualized bony structures of the thorax show no acute abnormality. IMPRESSION: No active disease. Electronically Signed   By: Misty Jensen M.D.   On: 10/24/2019 11:16      Subjective: He is feeling  Better. He understand that he is type 1 Diabetic and will always need long acting insulin.  He will arrange follow up with Dr [name] for next week.   Discharge Exam: Vitals:   10/27/19 0756 10/27/19 1151  BP: 125/71 136/85  Pulse: 91 (!) 106  Resp: 17 20  Temp: 98.3 F (36.8 C) 98.4 F (36.9 C)  SpO2: 100% 100%     General: Pt is alert, awake, not in acute distress Cardiovascular: RRR, S1/S2 +, no rubs, no gallops Respiratory: CTA bilaterally, no wheezing, no rhonchi Abdominal: Soft, NT, ND, bowel sounds + Extremities: no edema, no cyanosis    The results of significant diagnostics from this hospitalization (including imaging, microbiology,  ancillary and laboratory) are listed below for reference.     Microbiology: Recent Results (from the past 240 hour(s))  SARS Coronavirus 2 by RT PCR (hospital order, performed in [facility] hospital lab) Nasopharyngeal Nasopharyngeal Swab     Status: None   Collection Time: 10/24/19 10:54 AM   Specimen: Nasopharyngeal Swab  Result Value Ref Range Status   SARS Coronavirus 2 NEGATIVE NEGATIVE Final    Comment: (NOTE) SARS-CoV-2 target  nucleic acids are NOT DETECTED.  The SARS-CoV-2 RNA is generally detectable in upper and lower respiratory specimens during the acute phase of infection. The lowest concentration of SARS-CoV-2 viral copies this assay can detect is 250 copies / mL. A negative result does not preclude SARS-CoV-2 infection and should not be used as the sole basis for treatment or other patient management decisions.  A negative result may occur with improper specimen collection / handling, submission of specimen other than nasopharyngeal swab, presence of viral mutation(s) within the areas targeted by this assay, and inadequate number of viral copies (<250 copies / mL). A negative result must be combined with clinical observations, patient history, and epidemiological information.  Fact Sheet for Patients:   StrictlyIdeas.no  Fact Sheet for Healthcare Providers: BankingDealers.co.za  This test is not yet approved or  cleared by the [place] FDA and has been authorized for detection and/or diagnosis of SARS-CoV-2 by FDA under an Emergency Use Authorization (EUA).  This EUA will remain in effect (meaning this test can be used) for the duration of the COVID-19 declaration under Section 564(b)(1) of the Act, 21 U.S.C. section 360bbb-3(b)(1), unless the authorization is terminated or revoked sooner.  Performed at [facility], [place] [address], [place], [place] [address]   MRSA PCR Screening     Status: None   Collection Time: 10/26/19  1:15 AM   Specimen: Nasopharyngeal  Result Value Ref Range Status   MRSA by PCR NEGATIVE NEGATIVE Final    Comment:        The GeneXpert MRSA Assay (FDA approved for NASAL specimens only), is one component of a comprehensive MRSA colonization surveillance program. It is not intended to diagnose MRSA infection nor to guide or monitor treatment for MRSA infections. Performed at [facility], [place]  [address], [place], [place] [address]      Labs: BNP (last 3 results) No results for input(s): BNP in the last 8760 hours. Basic Metabolic Panel: Recent Labs  Lab 10/26/19 0458 10/26/19 0826 10/26/19 1349 10/26/19 1755 10/27/19 0232  NA 137 138 139 136 137  K 3.3* 3.1* 2.9* 3.0* 3.2*  CL 111 109 105 102 105  CO2 14* 16* 17* 20* 19*  GLUCOSE 181* 172* 155* 186* 260*  BUN 7 5* <5* <5* <5*  CREATININE 1.11 0.94 0.90 0.93 1.05  CALCIUM 8.5* 8.7* 8.8* 8.8* 8.9   Liver Function Tests: Recent Labs  Lab 10/24/19 1102  AST 10*  ALT 14  ALKPHOS 108  BILITOT 1.9*  PROT 8.6*  ALBUMIN 4.6   Recent Labs  Lab 10/24/19 1102  LIPASE 47   No results for input(s): AMMONIA in the last 168 hours. CBC: Recent Labs  Lab 10/24/19 1102 10/24/19 2039 10/26/19 0826  WBC 11.4*  --  6.5  HGB 17.3* 15.6 12.5*  HCT 51.1 46.0 34.9*  MCV 88.0  --  84.3  PLT 176  --  86*   Cardiac Enzymes: No results for input(s): CKTOTAL, CKMB, CKMBINDEX, TROPONINI in the last 168 hours. BNP:  Invalid input(s): POCBNP CBG: Recent Labs  Lab 10/26/19 1853 10/26/19 1942 10/26/19 2049 10/27/19 0756 10/27/19 1151  GLUCAP 153* 150* 147* 300* 221*   D-Dimer No results for input(s): DDIMER in the last 72 hours. Hgb A1c Recent Labs    10/24/19 2029  HGBA1C 12.2*   Lipid Profile No results for input(s): CHOL, HDL, LDLCALC, TRIG, CHOLHDL, LDLDIRECT in the last 72 hours. Thyroid function studies No results for input(s): TSH, T4TOTAL, T3FREE, THYROIDAB in the last 72 hours.  Invalid input(s): FREET3 Anemia work up No results for input(s): VITAMINB12, FOLATE, FERRITIN, TIBC, IRON, RETICCTPCT in the last 72 hours. Urinalysis    Component Value Date/Time   COLORURINE STRAW (A) 10/24/2019 2029   APPEARANCEUR CLEAR 10/24/2019 2029   LABSPEC 1.023 10/24/2019 2029   PHURINE 5.0 10/24/2019 2029   GLUCOSEU >=500 (A) 10/24/2019 2029   HGBUR SMALL (A) 10/24/2019 2029   BILIRUBINUR NEGATIVE 10/24/2019 2029    KETONESUR 80 (A) 10/24/2019 2029   PROTEINUR 100 (A) 10/24/2019 2029   UROBILINOGEN 0.2 12/08/2014 1445   NITRITE NEGATIVE 10/24/2019 2029   LEUKOCYTESUR NEGATIVE 10/24/2019 2029   Sepsis Labs Invalid input(s): PROCALCITONIN,  WBC,  LACTICIDVEN Microbiology Recent Results (from the past 240 hour(s))  SARS Coronavirus 2 by RT PCR (hospital order, performed in [facility] hospital lab) Nasopharyngeal Nasopharyngeal Swab     Status: None   Collection Time: 10/24/19 10:54 AM   Specimen: Nasopharyngeal Swab  Result Value Ref Range Status   SARS Coronavirus 2 NEGATIVE NEGATIVE Final    Comment: (NOTE) SARS-CoV-2 target nucleic acids are NOT DETECTED.  The SARS-CoV-2 RNA is generally detectable in upper and lower respiratory specimens during the acute phase of infection. The lowest concentration of SARS-CoV-2 viral copies this assay can detect is 250 copies / mL. A negative result does not preclude SARS-CoV-2 infection and should not be used as the sole basis for treatment or other patient management decisions.  A negative result may occur with improper specimen collection / handling, submission of specimen other than nasopharyngeal swab, presence of viral mutation(s) within the areas targeted by this assay, and inadequate number of viral copies (<250 copies / mL). A negative result must be combined with clinical observations, patient history, and epidemiological information.  Fact Sheet for Patients:   StrictlyIdeas.no  Fact Sheet for Healthcare Providers: BankingDealers.co.za  This test is not yet approved or  cleared by the [place] FDA and has been authorized for detection and/or diagnosis of SARS-CoV-2 by FDA under an Emergency Use Authorization (EUA).  This EUA will remain in effect (meaning this test can be used) for the duration of the COVID-19 declaration under Section 564(b)(1) of the Act, 21 U.S.C. section  360bbb-3(b)(1), unless the authorization is terminated or revoked sooner.  Performed at [facility], [place] [address], [place], [place] [address]   MRSA PCR Screening     Status: None   Collection Time: 10/26/19  1:15 AM   Specimen: Nasopharyngeal  Result Value Ref Range Status   MRSA by PCR NEGATIVE NEGATIVE Final    Comment:        The GeneXpert MRSA Assay (FDA approved for NASAL specimens only), is one component of a comprehensive MRSA colonization surveillance program. It is not intended to diagnose MRSA infection nor to guide or monitor treatment for MRSA infections. Performed at [facility], [place] [address] [place], [place] [address]      Time coordinating discharge: 40 minutes  SIGNED:   Jerald Kief  Desiree Lucy, MD  [facility]

## 2019-10-27 NOTE — Progress Notes (Signed)
IV's removed. Tips intact. AVS documentation given and explained. Pt. uninterested in discharge instructions given, was playing on phone the whole time instructions were being reviewed. Pt. discharged home via car.

## 2019-10-28 ENCOUNTER — Telehealth: Payer: Self-pay | Admitting: Internal Medicine

## 2019-10-28 MED ORDER — INSULIN LISPRO (1 UNIT DIAL) 100 UNIT/ML (KWIKPEN): 6.0000 [IU] | PEN_INJECTOR | Freq: Three times a day (TID) | SUBCUTANEOUS | 1 refills | Status: DC

## 2019-10-28 MED ORDER — LANTUS SOLOSTAR 100 UNIT/ML ~~LOC~~ SOPN: 20.0000 [IU] | PEN_INJECTOR | Freq: Every day | SUBCUTANEOUS | 1 refills | Status: DC

## 2019-10-28 NOTE — Telephone Encounter (Signed)
Please advise if ok to switch 

## 2019-10-28 NOTE — Telephone Encounter (Signed)
Patient called to check in on the call from this morning, he states he is completely out and was just calling to check up on status. Ph# [phone]

## 2019-10-28 NOTE — Telephone Encounter (Signed)
Patient's mother called stating patient was in hospital over the weekend and they called in some insulin for patient but the insurance does not cover Novolog - they cover Humalog instead. Also they don't cover Evaristo Bury but they cover Lantus as an alternative.  Requesting to call in Humalog and Lantus to:  [id number] - [place],  - [address] AT [facility] OF [address] Phone:  [phone]  Fax:  [phone]

## 2019-10-28 NOTE — Addendum Note (Signed)
Addended by: Darliss Ridgel I on: 10/28/2019 02:42 PM   Modules accepted: Orders

## 2019-10-28 NOTE — Telephone Encounter (Signed)
Okay to switch both.

## 2019-10-28 NOTE — Telephone Encounter (Signed)
RXs sent.

## 2019-10-29 ENCOUNTER — Ambulatory Visit (INDEPENDENT_AMBULATORY_CARE_PROVIDER_SITE_OTHER): Payer: 59 | Admitting: Internal Medicine

## 2019-10-29 ENCOUNTER — Other Ambulatory Visit: Payer: Self-pay

## 2019-10-29 ENCOUNTER — Encounter: Payer: Self-pay | Admitting: Internal Medicine

## 2019-10-29 VITALS — BP 122/84 | HR 84 | Ht 74.0 in | Wt 245.4 lb

## 2019-10-29 DIAGNOSIS — E669 Obesity, unspecified: Secondary | ICD-10-CM | POA: Diagnosis not present

## 2019-10-29 DIAGNOSIS — E1110 Type 2 diabetes mellitus with ketoacidosis without coma: Secondary | ICD-10-CM

## 2019-10-29 DIAGNOSIS — E66811 Obesity, class 1: Secondary | ICD-10-CM | POA: Insufficient documentation

## 2019-10-29 MED ORDER — INSULIN LISPRO (1 UNIT DIAL) 100 UNIT/ML (KWIKPEN): 4.0000 [IU] | PEN_INJECTOR | Freq: Three times a day (TID) | SUBCUTANEOUS | 3 refills | Status: DC

## 2019-10-29 NOTE — Progress Notes (Signed)
Patient ID: Terry Jensen, male   DOB: 19-Feb-1995, 24 y.o.   MRN: [id number]   This visit occurred during the SARS-CoV-2 public health emergency.  Safety protocols were in place, including screening questions prior to the visit, additional usage of staff PPE, and extensive cleaning of exam room while observing appropriate contact time as indicated for disinfecting solutions.   HPI: Terry Jensen is a 24 y.o.-year-old male, initially referred by Dr. Dayna Barker, now returning for follow-up for DM, dx in 03/2018, insulin-dependent, uncontrolled, without long-term complications, but with DKA.  Last visit 6 months ago.  Since last visit, he was in the emergency room in DKA on 10/24/2019 after running out of insulin for 2 weeks (!!!)  And drank more alcohol.  Glucose was 414 on arrival.  Reviewed history: Patient was diagnosed with diabetes 5 days before last visit, when he presented to the emergency room with fatigue, increased thirst and urination and nausea and a CBG >600.  He was also found to be in DKA and was admitted.  He was discharged on basal-bolus insulin regimen.  However, at last visit, he could not afford his long-acting insulin and was only taking his rapid acting insulin.   Reviewed HbA1c levels: Lab Results  Component Value Date   HGBA1C 12.2 (H) 10/24/2019   HGBA1C 12.0 (A) 03/30/2018   He was on: - Humalog 4 units 3x a day, after meals He was also Rx'ed Lantus 24 units at bedtime >> could not afford!  We changed to: - Tresiba 18 units daily >> Lantus 12 units at bedtime  >> ran out 2 days before last appointment and we kept it off  Pt checks his sugars 3-4 times a day - before starting Lantus (yesterday) - am: 200, 225 >> 90-120, (off Humalog): 98 >> 160-200s - 2h after b'fast: n/c - before lunch: 190 >> 130-150 (juice), (off Humalog): 115 >> 150s - 2h after lunch: n/c - before dinner: 200 >> 120-130, (off Humalog): 100 >> n/c - 2h after dinner: 190-225 >> 200 - bedtime:  n/c - nighttime: n/c Lowest sugar was 190 >> 90 >> 125; it is unclear at which level he has hypoglycemia awareness. Highest sugar was >600 >> 150 >> 350.  Glucometer: One Touch Verio  Pt's meals are: - Breakfast: eggs + fruit - Lunch: chicken + green beans >> skips - Dinner: fish/chicken + veggies + occasional starches - Snacks: water or sugar free drinks Before his dx >> he was drinking a lot of apple juice.  He stopped afterwards, but he was still drinking it occasionally at last visit.  I advised him to stop.  -No CKD, last BUN/creatinine:  Lab Results  Component Value Date   BUN <5 (L) 10/27/2019   BUN <5 (L) 10/26/2019   CREATININE 1.05 10/27/2019   CREATININE 0.93 10/26/2019  On losartan 50.  -+ HL; last set of lipids: Lab Results  Component Value Date   CHOL 211 (H) 05/11/2018   HDL 25.70 (L) 05/11/2018   LDLDIRECT 148.0 05/11/2018   TRIG 227.0 (H) 05/11/2018   CHOLHDL 8 05/11/2018   - last eye exam was " long time ago".  He still did not schedule an appointment with Dr. Randon Goldsmith, despite repeated advice.  -No numbness and tingling in his feet.  Pt has FH of DM in [facility], M great aunt, 2nd cousin.  He has a history of HTN.  Latest TSH: Lab Results  Component Value Date   TSH 1.48 05/11/2018  TSH 0.373 03/27/2018   He is a cook.  ROS: Constitutional: no weight gain/+ weight loss, no fatigue, no subjective hyperthermia, no subjective hypothermia Eyes: no blurry vision, no xerophthalmia ENT: no sore throat, no nodules palpated in neck, no dysphagia, no odynophagia, no hoarseness Cardiovascular: no CP/no SOB/no palpitations/no leg swelling Respiratory: no cough/no SOB/no wheezing Gastrointestinal: no N/no V/no D/no C/no acid reflux Musculoskeletal: no muscle aches/no joint aches Skin: no rashes, no hair loss Neurological: no tremors/no numbness/no tingling/no dizziness  I reviewed pt's medications, allergies, PMH, social hx, family hx, and changes were  documented in the history of present illness. Otherwise, unchanged from my initial visit note.  Past Medical History:  Diagnosis Date  . Diabetes mellitus without complication (HCC)   . Hypertension     -Except as above  No past surgical history on file.   Social History   Socioeconomic History  . Marital status: Single    Spouse name: Not on file  . Number of children: 0  . Years of education: Not on file  . Highest education level: Not on file  Occupational History  .  Cook  Social Needs  . Financial resource strain: Not on file  . Food insecurity:    Worry: Not on file    Inability: Not on file  . Transportation needs:    Medical: Not on file    Non-medical: Not on file  Tobacco Use  . Smoking status: Never Smoker  . Smokeless tobacco:  Vaping  Substance and Sexual Activity  . Alcohol use: No  . Drug use: No   Current Outpatient Medications  Medication Sig Dispense Refill  . glucose blood (ONETOUCH VERIO) test strip Use as instructed 3x a day (Patient not taking: Reported on 10/24/2019) 300 each 3  . insulin glargine (LANTUS SOLOSTAR) 100 UNIT/ML Solostar Pen Inject 20 Units into the skin daily. 18 mL 1  . insulin lispro (HUMALOG KWIKPEN) 100 UNIT/ML KwikPen Inject 6 Units into the skin 3 (three) times daily. 16.2 mL 1  . Insulin Pen Needle 32G X 4 MM MISC Use 4x a day 300 each 3  . Lancets (ONETOUCH ULTRASOFT) lancets Use as instructed 3x a day 300 each 3   No current facility-administered medications for this visit.    No Known Allergies Family History  Problem Relation Age of Onset  . Cancer Other   . Diabetes Other   . Sudden death Neg Hx   . Heart attack Neg Hx   Also please see HPI.  PE: BP 122/84 (BP Location: Left Arm, Patient Position: Sitting, Cuff Size: Small)   Pulse 84   Ht 6\' 2"  (1.88 m)   Wt 245 lb 6.4 oz (111.3 kg)   SpO2 98%   BMI 31.51 kg/m  Wt Readings from Last 3 Encounters:  10/29/19 245 lb 6.4 oz (111.3 kg)  10/24/19  lb (122.5  kg)  05/11/18 274 lb (124.3 kg)   Constitutional: overweight, in NAD Eyes: PERRLA, EOMI, no exophthalmos ENT: moist mucous membranes, no thyromegaly, no cervical lymphadenopathy Cardiovascular: RRR, No MRG Respiratory: CTA B Gastrointestinal: abdomen soft, NT, ND, BS+ Musculoskeletal: no deformities, strength intact in all 4 Skin: moist, warm, no rashes Neurological: no tremor with outstretched hands, DTR normal in all 4  ASSESSMENT: 1. DM, insulin-dependent, uncontrolled, without long-term complications, but with DKA  2.  Obesity class I  PLAN:  1. Patient with history of relatively recent diagnosis of diabetes, possibly triggered by his juice intake and eating large portions  of food.  He changed this since diagnosis.  He reduced his portions and drastically reduced stools.  He was still drinking some at last visit and I advised him to stop.  We could not check him for type 1 diabetes yet as he was too low or toxic at the time of the appointment.  Therefore, we are still treating him as type 1 diabetes.  He did see the nutritionist since then.  -Of note, he had another episode of DKA 5 days ago.  He describes that he ran out of his long-acting insulin for 2 weeks at that time he was also drinking more heavily.  He became very dehydrated.  When he went to the emergency room, the recorded weight is 270 pounds, but this was not measured, but rather given by the patient.  At today's visit, his weight is 245, which I suspect is more accurate.  Therefore, he lost approximately 30 pounds since our last visit.  This is most likely related to dehydration.  It is very likely that he has type 1 diabetes.  I strongly advised him not to run out of insulin anymore and also to try to not drink alcohol. -His most recent HbA1c was still extremely high, at 12.2%, even higher than before. -At last visit, he ran out of Humalog blood sugars were fairly well controlled so I advised him to let me know about the  sugars in 1 week to see if we needed to add this back.  However, he was lost for follow-up for 6 months afterwards. -At this visit, he tells me he only started Lantus yesterday, so it is unclear how this impacted his blood sugars.  He only started at 12 units, however, and I advised him to increase the dose to 20 units.  We will also add back Humalog (I sent a new prescription to his pharmacy). - I suggested to:  Patient Instructions  Please Increase: - Lantus to 20 units daily  Restart: - Humalog 15 min before each meal: - 4 units before a smaller meal - 6 units before a regular meal - 8 units before a larger meal or if you have dessert   Please schedule the eye exam.   DO NOT RUN OUT OF INSULIN!!!!   Please come back for a follow-up appointment in 1.5 months.  - advised to check sugars at different times of the day - 3-4x a day, rotating check times - advised for yearly eye exams >> he is not UTD and I again advised him to schedule one - return to clinic in 1.5 months  2.  Obesity class I -No weight loss before last visit, but lost 30 pounds before his episode of DKA most likely due to dehydration -He just restarted basal insulin yesterday and will start mealtime insulin now  Terry Pavlov, Terry Jensen [facility]

## 2019-10-29 NOTE — Patient Instructions (Addendum)
Please Increase: - Lantus to 20 units daily  Restart: - Humalog 15 min before each meal: - 4 units before a smaller meal - 6 units before a regular meal - 8 units before a larger meal or if you have dessert   Please schedule the eye exam.   DO NOT RUN OUT OF INSULIN!!!!   Please come back for a follow-up appointment in 1.5 months.

## 2019-12-12 ENCOUNTER — Ambulatory Visit: Payer: 59 | Admitting: Internal Medicine

## 2019-12-12 NOTE — Progress Notes (Deleted)
Patient ID: Terry Jensen, male   DOB: 04/25/1995, 24 y.o.   MRN: [id number]   This visit occurred during the SARS-CoV-2 public health emergency.  Safety protocols were in place, including screening questions prior to the visit, additional usage of staff PPE, and extensive cleaning of exam room while observing appropriate contact time as indicated for disinfecting solutions.   HPI: Terry Jensen is a 24 y.o.-year-old male, initially referred by Dr. Dayna Barker, returning for follow-up for DM, dx in 03/2018, insulin-dependent, uncontrolled, without long-term complications, but with DKA.  Last visit 1.5 months ago.   Before last visit, he was in the ED in DKA on 10/24/2019 after running out of insulin for 2 weeks (!!!) and  also drinking more alcohol.  I saw him right after this episode and we increased his Lantus and restarting his Humalog.  Reviewed history: Patient was diagnosed with diabetes 5 days before last visit, when he presented to the emergency room with fatigue, increased thirst and urination and nausea and a CBG >600.  He was also found to be in DKA and was admitted.  He was discharged on basal-bolus insulin regimen.  However, at last visit, he could not afford his long-acting insulin and was only taking his rapid acting insulin.   Reviewed HbA1c levels: Lab Results  Component Value Date   HGBA1C 12.2 (H) 10/24/2019   HGBA1C 12.0 (A) 03/30/2018   He was on: - Humalog 4 units 3x a day, after meals He was also Rx'ed Lantus 24 units at bedtime >> could not afford!  At last visit, he was on: - Tresiba 18 units daily >> Lantus 12 units at bedtime  >> off  We changed to: - Lantus 20 units daily - Humalog 15 min before each meal: - 4 units before a smaller meal - 6 units before a regular meal - 8 units before a larger meal or if you have dessert  Pt checks his sugars 3-4 times a day: - am: 200, 225 >> 90-120, (off Humalog): 98 >> 160-200s - 2h after b'fast: n/c - before lunch: 190  >> 130-150 (juice), (off Humalog): 115 >> 150s - 2h after lunch: n/c - before dinner: 200 >> 120-130, (off Humalog): 100 >> n/c - 2h after dinner: 190-225 >> 200 - bedtime: n/c - nighttime: n/c Lowest sugar was 190 >> 90 >> 125; it is unclear at which level he has hypoglycemia awareness. Highest sugar was >600 >> 150 >> 350.  Glucometer: One Touch Verio  Pt's meals are: - Breakfast: eggs + fruit - Lunch: chicken + green beans >> skips - Dinner: fish/chicken + veggies + occasional starches - Snacks: water or sugar free drinks Before his dx >> he was drinking a lot of apple juice.    -No CKD, last BUN/creatinine:  Lab Results  Component Value Date   BUN <5 (L) 10/27/2019   BUN <5 (L) 10/26/2019   CREATININE 1.05 10/27/2019   CREATININE 0.93 10/26/2019  On losartan 50.  -+ HL; last set of lipids: Lab Results  Component Value Date   CHOL 211 (H) 05/11/2018   HDL 25.70 (L) 05/11/2018   LDLDIRECT 148.0 05/11/2018   TRIG 227.0 (H) 05/11/2018   CHOLHDL 8 05/11/2018  He is not on a statin.  - last eye exam was a "long time ago".  He still did not schedule an appointment with Dr. Randon Goldsmith, despite repeated advice.  -He denies numbness and tingling in his feet.  Pt has FH of DM in  [place], M great aunt, 2nd cousin.  He also has HTN.  Reviewed latest TSH levels: Lab Results  Component Value Date   TSH 1.48 05/11/2018   TSH 0.373 03/27/2018   He is a cook.  ROS: Constitutional: no weight gain/no weight loss, no fatigue, no subjective hyperthermia, no subjective hypothermia Eyes: no blurry vision, no xerophthalmia ENT: no sore throat, no nodules palpated in neck, no dysphagia, no odynophagia, no hoarseness Cardiovascular: no CP/no SOB/no palpitations/no leg swelling Respiratory: no cough/no SOB/no wheezing Gastrointestinal: no N/no V/no D/no C/no acid reflux Musculoskeletal: no muscle aches/no joint aches Skin: no rashes, no hair loss Neurological: no tremors/no numbness/no  tingling/no dizziness  I reviewed pt's medications, allergies, PMH, social hx, family hx, and changes were documented in the history of present illness. Otherwise, unchanged from my initial visit note.  Past Medical History:  Diagnosis Date  . Diabetes mellitus without complication (HCC)   . Hypertension     -Except as above  No past surgical history on file.   Social History   Socioeconomic History  . Marital status: Single    Spouse name: Not on file  . Number of children: 0  . Years of education: Not on file  . Highest education level: Not on file  Occupational History  .  Cook  Social Needs  . Financial resource strain: Not on file  . Food insecurity:    Worry: Not on file    Inability: Not on file  . Transportation needs:    Medical: Not on file    Non-medical: Not on file  Tobacco Use  . Smoking status: Never Smoker  . Smokeless tobacco:  Vaping  Substance and Sexual Activity  . Alcohol use: No  . Drug use: No   Current Outpatient Medications  Medication Sig Dispense Refill  . glucose blood (ONETOUCH VERIO) test strip Use as instructed 3x a day 300 each 3  . insulin glargine (LANTUS SOLOSTAR) 100 UNIT/ML Solostar Pen Inject 20 Units into the skin daily. 18 mL 1  . insulin lispro (HUMALOG KWIKPEN) 100 UNIT/ML KwikPen Inject 4-8 Units into the skin 3 (three) times daily. 30 mL 3  . Insulin Pen Needle 32G X 4 MM MISC Use 4x a day 300 each 3  . Lancets (ONETOUCH ULTRASOFT) lancets Use as instructed 3x a day 300 each 3   No current facility-administered medications for this visit.    No Known Allergies Family History  Problem Relation Age of Onset  . Cancer Other   . Diabetes Other   . Sudden death Neg Hx   . Heart attack Neg Hx   Also please see HPI.  PE: There were no vitals taken for this visit. Wt Readings from Last 3 Encounters:  10/29/19 245 lb 6.4 oz (111.3 kg)  10/24/19 270 lb (122.5 kg)  05/11/18 274 lb (124.3 kg)   Constitutional:  overweight, in NAD Eyes: PERRLA, EOMI, no exophthalmos ENT: moist mucous membranes, no thyromegaly, no cervical lymphadenopathy Cardiovascular: RRR, No MRG Respiratory: CTA B Gastrointestinal: abdomen soft, NT, ND, BS+ Musculoskeletal: no deformities, strength intact in all 4 Skin: moist, warm, no rashes Neurological: no tremor with outstretched hands, DTR normal in all 4  ASSESSMENT: 1. DM, insulin-dependent, uncontrolled, without long-term complications, but with DKA  2.  Obesity class I  PLAN:  1. Patient with history of relatively recent diagnosis of diabetes, possibly triggered by his juice intake and eating large portions of food.  He improved this since his diagnosis.  He  reduced his portions drastically and cut down sweet drinks.  We could not check him for type 1 diabetes since his sugars were too high but we are treating him as if he has this condition.  He did have 2 episodes of DKA, last being right before last visit after stopping insulin and drinking more alcohol.  At that time, HbA1c was 12.2%, even higher than before.  I strongly advised him against running out of insulin.  At that time, he just restarted Lantus the day prior to the visit and we increase the dose.  We also added Humalog before each meal.  - I suggested to:  Patient Instructions  Please continue: - Lantus 20 units daily - Humalog 15 min before each meal: - 4 units before a smaller meal - 6 units before a regular meal - 8 units before a larger meal or if you have dessert   Please come back for a follow-up appointment in 3 months.  - we checked his HbA1c: 7%  - advised to check sugars at different times of the day - 3-4x a day, rotating check times - advised for yearly eye exams >> he is UTD - return to clinic in 3 months  2.  Obesity class I -He lost approximately 30 pounds before his episode of DKA, but weight recovered afterwards. -In the future, if he does not have any more episodes of DKA, we can  consider Metformin to reduce his insulin resistance.  However, this would be used off label.  Carlus Pavlov, MD PhD [facility]

## 2020-11-12 ENCOUNTER — Other Ambulatory Visit: Payer: Self-pay | Admitting: Internal Medicine

## 2020-11-12 DIAGNOSIS — E1110 Type 2 diabetes mellitus with ketoacidosis without coma: Secondary | ICD-10-CM

## 2022-05-18 IMAGING — DX DG CHEST 1V PORT
1 series · 2 of 2 positions shown · non-contrast
Comparison: 03/25/2018

CLINICAL DATA: Shortness of breath.  Diabetes.

EXAM:
PORTABLE CHEST 1 VIEW

[Series 1: chest ap · 0.14mm/px · 2 of 2 slices shown]
[im 1/2]
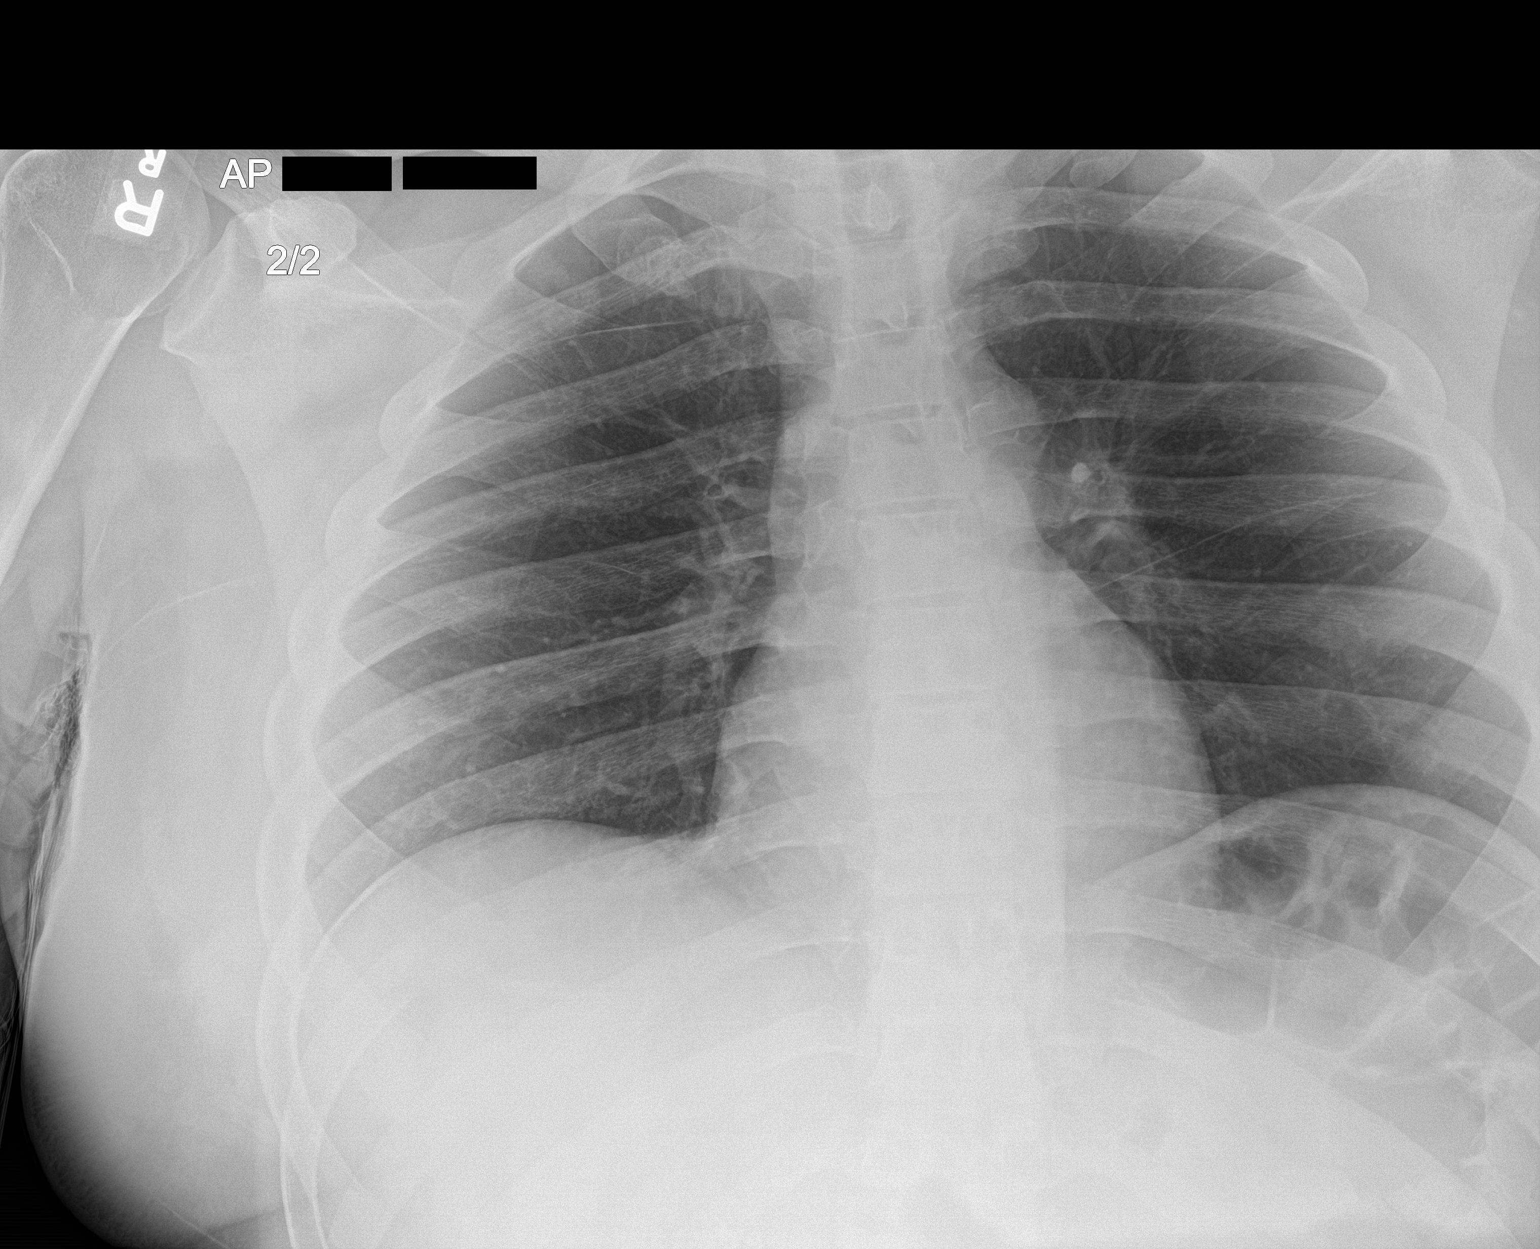
[im 2/2]
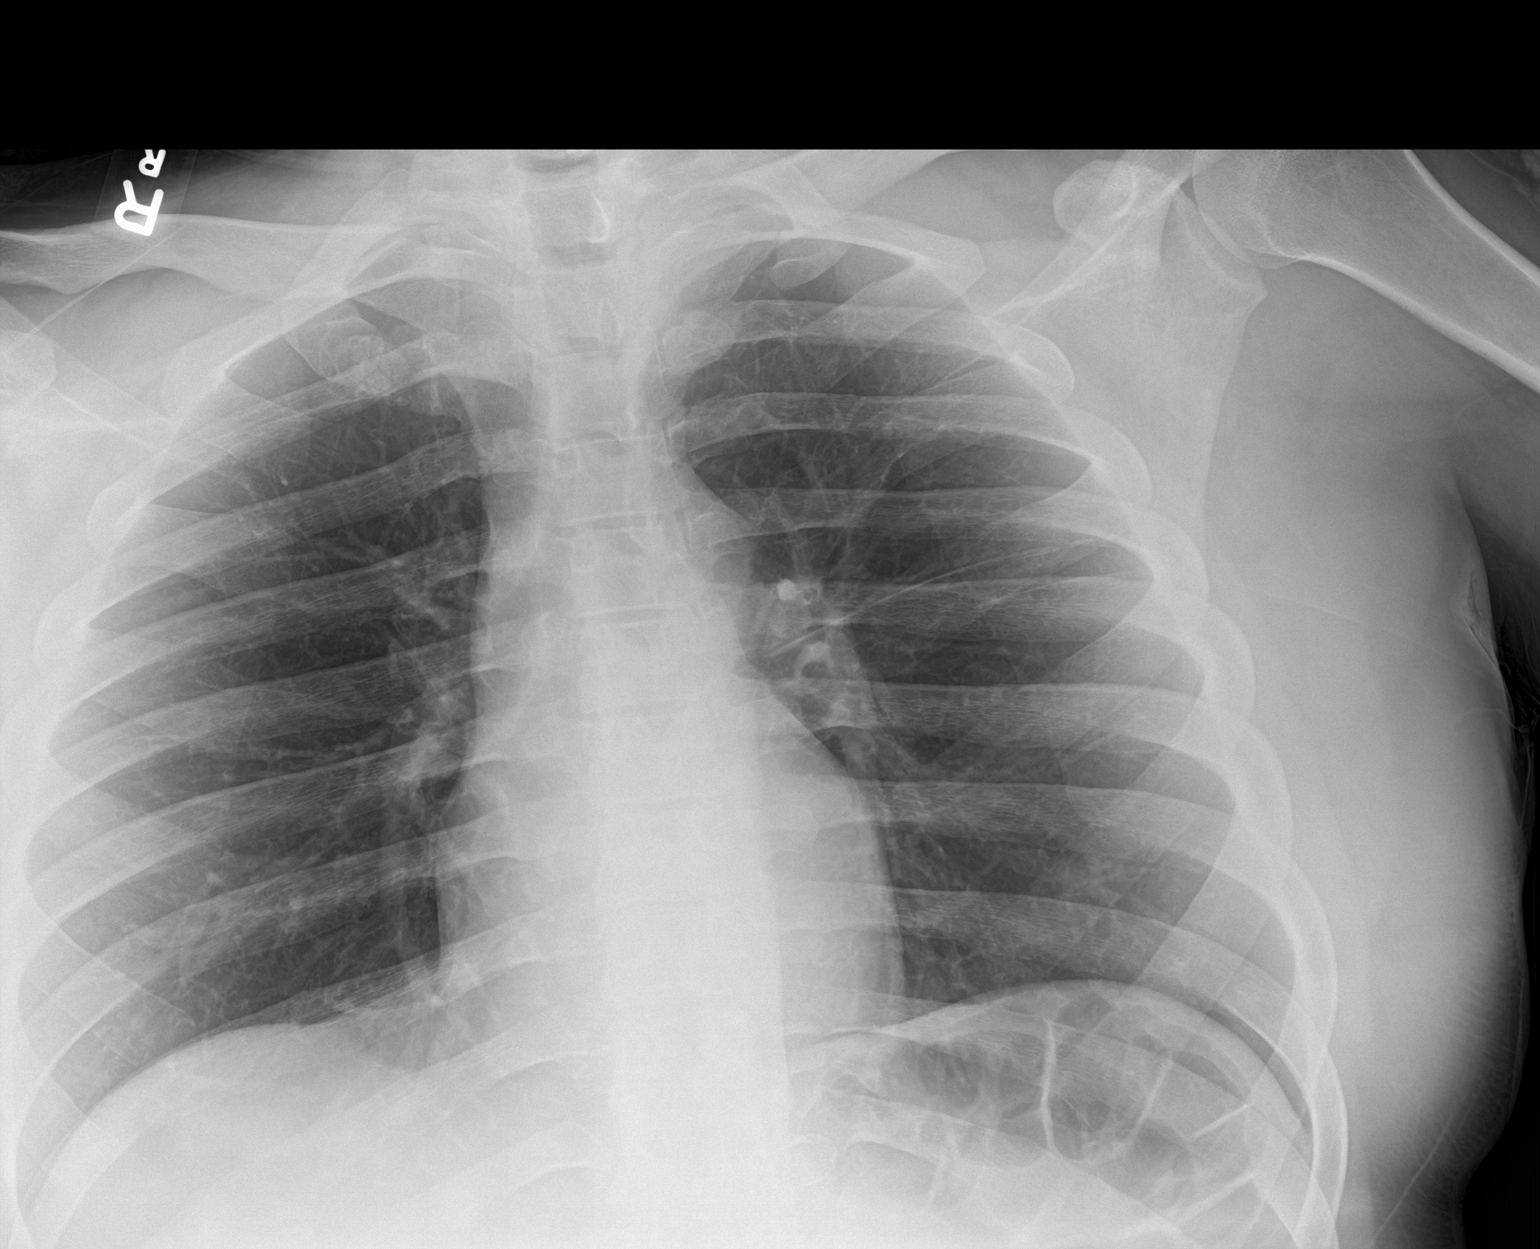

[2 of 2 positions shown; findings below may reference images not displayed]

FINDINGS: 5569 hours. The lungs are clear without focal pneumonia, edema,
pneumothorax or pleural effusion. The cardiopericardial silhouette
is within normal limits for size. The visualized bony structures of
the thorax show no acute abnormality.
IMPRESSION: No active disease.

## 2023-03-15 ENCOUNTER — Observation Stay (HOSPITAL_COMMUNITY): Payer: 59

## 2023-03-15 ENCOUNTER — Other Ambulatory Visit: Payer: Self-pay

## 2023-03-15 ENCOUNTER — Encounter (HOSPITAL_COMMUNITY): Payer: Self-pay

## 2023-03-15 ENCOUNTER — Observation Stay (HOSPITAL_COMMUNITY)
Admission: EM | Admit: 2023-03-15 | Discharge: 2023-03-17 | Disposition: A | Discharge status: Home / Self Care | Payer: 59 | Attending: Internal Medicine | Admitting: Internal Medicine

## 2023-03-15 DIAGNOSIS — E785 Hyperlipidemia, unspecified: Secondary | ICD-10-CM | POA: Insufficient documentation

## 2023-03-15 DIAGNOSIS — R748 Abnormal levels of other serum enzymes: Secondary | ICD-10-CM | POA: Diagnosis not present

## 2023-03-15 DIAGNOSIS — I10 Essential (primary) hypertension: Secondary | ICD-10-CM | POA: Diagnosis not present

## 2023-03-15 DIAGNOSIS — Z794 Long term (current) use of insulin: Secondary | ICD-10-CM | POA: Diagnosis not present

## 2023-03-15 DIAGNOSIS — E1010 Type 1 diabetes mellitus with ketoacidosis without coma: Secondary | ICD-10-CM | POA: Diagnosis present

## 2023-03-15 DIAGNOSIS — E1110 Type 2 diabetes mellitus with ketoacidosis without coma: Secondary | ICD-10-CM | POA: Diagnosis present

## 2023-03-15 LAB — I-STAT CHEM 8, ED
BUN: 27 mg/dL — ABNORMAL HIGH (ref 6–20)
Calcium, Ion: 1.15 mmol/L (ref 1.15–1.40)
Chloride: 107 mmol/L (ref 98–111)
Creatinine, Ser: 1.6 mg/dL — ABNORMAL HIGH (ref 0.61–1.24)
Glucose, Bld: 531 mg/dL (ref 70–99)
HCT: 58 % — ABNORMAL HIGH (ref 39.0–52.0)
Hemoglobin: 19.7 g/dL — ABNORMAL HIGH (ref 13.0–17.0)
Potassium: 5.1 mmol/L (ref 3.5–5.1)
Sodium: 132 mmol/L — ABNORMAL LOW (ref 135–145)
TCO2: 8 mmol/L — ABNORMAL LOW (ref 22–32)

## 2023-03-15 LAB — I-STAT VENOUS BLOOD GAS, ED
Acid-base deficit: 20 mmol/L — ABNORMAL HIGH (ref 0.0–2.0)
Bicarbonate: 6.6 mmol/L — ABNORMAL LOW (ref 20.0–28.0)
Calcium, Ion: 1.15 mmol/L (ref 1.15–1.40)
HCT: 57 % — ABNORMAL HIGH (ref 39.0–52.0)
Hemoglobin: 19.4 g/dL — ABNORMAL HIGH (ref 13.0–17.0)
O2 Saturation: 65 %
Potassium: 5 mmol/L (ref 3.5–5.1)
Sodium: 130 mmol/L — ABNORMAL LOW (ref 135–145)
TCO2: 7 mmol/L — ABNORMAL LOW (ref 22–32)
pCO2, Ven: 20.1 mm[Hg] — ABNORMAL LOW (ref 44–60)
pH, Ven: 7.121 — CL (ref 7.25–7.43)
pO2, Ven: 43 mm[Hg] (ref 32–45)

## 2023-03-15 LAB — URINALYSIS, COMPLETE (UACMP) WITH MICROSCOPIC
Bilirubin Urine: NEGATIVE
Glucose, UA: >=500 mg/dL — AB
Ketones, ur: 80 mg/dL — AB
Leukocytes,Ua: NEGATIVE
Nitrite: NEGATIVE
Protein, ur: 100 mg/dL — AB
Specific Gravity, Urine: 1.023 (ref 1.005–1.030)
pH: 5 (ref 5.0–8.0)

## 2023-03-15 LAB — CBG MONITORING, ED
Glucose-Capillary: 500 mg/dL — ABNORMAL HIGH (ref 70–99)
Glucose-Capillary: >600 mg/dL (ref 70–99)
Glucose-Capillary: >600 mg/dL (ref 70–99)
Glucose-Capillary: 390 mg/dL — ABNORMAL HIGH (ref 70–99)
Glucose-Capillary: 492 mg/dL — ABNORMAL HIGH (ref 70–99)
Glucose-Capillary: 310 mg/dL — ABNORMAL HIGH (ref 70–99)
Glucose-Capillary: 275 mg/dL — ABNORMAL HIGH (ref 70–99)
Glucose-Capillary: 261 mg/dL — ABNORMAL HIGH (ref 70–99)
Glucose-Capillary: 206 mg/dL — ABNORMAL HIGH (ref 70–99)

## 2023-03-15 LAB — CBC
HCT: 54.1 % — ABNORMAL HIGH (ref 39.0–52.0)
Hemoglobin: 18.7 g/dL — ABNORMAL HIGH (ref 13.0–17.0)
MCH: 30.9 pg (ref 26.0–34.0)
MCHC: 34.6 g/dL (ref 30.0–36.0)
MCV: 89.4 fL (ref 80.0–100.0)
Platelets: 196 10*3/uL (ref 150–400)
RBC: 6.05 MIL/uL — ABNORMAL HIGH (ref 4.22–5.81)
RDW: 12.8 % (ref 11.5–15.5)
WBC: 13.7 10*3/uL — ABNORMAL HIGH (ref 4.0–10.5)
nRBC: 0 % (ref 0.0–0.2)

## 2023-03-15 LAB — BETA-HYDROXYBUTYRIC ACID
Beta-Hydroxybutyric Acid: >8 mmol/L — ABNORMAL HIGH (ref 0.05–0.27)
Beta-Hydroxybutyric Acid: 7.17 mmol/L — ABNORMAL HIGH (ref 0.05–0.27)

## 2023-03-15 LAB — COMPREHENSIVE METABOLIC PANEL
ALT: 21 U/L (ref 0–44)
AST: 14 U/L — ABNORMAL LOW (ref 15–41)
Albumin: 4.9 g/dL (ref 3.5–5.0)
Alkaline Phosphatase: 92 U/L (ref 38–126)
BUN: 23 mg/dL — ABNORMAL HIGH (ref 6–20)
CO2: <7 mmol/L — ABNORMAL LOW (ref 22–32)
Calcium: 9.6 mg/dL (ref 8.9–10.3)
Chloride: 98 mmol/L (ref 98–111)
Creatinine, Ser: 2.22 mg/dL — ABNORMAL HIGH (ref 0.61–1.24)
GFR, Estimated: 41 mL/min — ABNORMAL LOW (ref >60)
Glucose, Bld: 545 mg/dL (ref 70–99)
Potassium: 5.1 mmol/L (ref 3.5–5.1)
Sodium: 132 mmol/L — ABNORMAL LOW (ref 135–145)
Total Bilirubin: 1.6 mg/dL — ABNORMAL HIGH (ref 0.0–1.2)
Total Protein: 8.9 g/dL — ABNORMAL HIGH (ref 6.5–8.1)

## 2023-03-15 LAB — BASIC METABOLIC PANEL
Anion gap: 26 — ABNORMAL HIGH (ref 5–15)
Anion gap: 21 — ABNORMAL HIGH (ref 5–15)
BUN: 26 mg/dL — ABNORMAL HIGH (ref 6–20)
BUN: 25 mg/dL — ABNORMAL HIGH (ref 6–20)
CO2: 7 mmol/L — ABNORMAL LOW (ref 22–32)
CO2: 11 mmol/L — ABNORMAL LOW (ref 22–32)
Calcium: 9.6 mg/dL (ref 8.9–10.3)
Calcium: 9.4 mg/dL (ref 8.9–10.3)
Chloride: 102 mmol/L (ref 98–111)
Chloride: 106 mmol/L (ref 98–111)
Creatinine, Ser: 2.06 mg/dL — ABNORMAL HIGH (ref 0.61–1.24)
Creatinine, Ser: 1.96 mg/dL — ABNORMAL HIGH (ref 0.61–1.24)
GFR, Estimated: 45 mL/min — ABNORMAL LOW (ref >60)
GFR, Estimated: 48 mL/min — ABNORMAL LOW (ref >60)
Glucose, Bld: 423 mg/dL — ABNORMAL HIGH (ref 70–99)
Glucose, Bld: 281 mg/dL — ABNORMAL HIGH (ref 70–99)
Potassium: 4.3 mmol/L (ref 3.5–5.1)
Potassium: 3.6 mmol/L (ref 3.5–5.1)
Sodium: 135 mmol/L (ref 135–145)
Sodium: 138 mmol/L (ref 135–145)

## 2023-03-15 LAB — TSH: TSH: 1.5 u[IU]/mL (ref 0.350–4.500)

## 2023-03-15 LAB — HEMOGLOBIN A1C
Hgb A1c MFr Bld: 12.4 % — ABNORMAL HIGH (ref 4.8–5.6)
Mean Plasma Glucose: 309.18 mg/dL

## 2023-03-15 LAB — LIPID PANEL
Cholesterol: 415 mg/dL — ABNORMAL HIGH (ref 0–200)
HDL: 43 mg/dL (ref >40)
LDL Cholesterol: 297 mg/dL — ABNORMAL HIGH (ref 0–99)
Total CHOL/HDL Ratio: 9.7 {ratio}
Triglycerides: 373 mg/dL — ABNORMAL HIGH (ref <150)
VLDL: 75 mg/dL — ABNORMAL HIGH (ref 0–40)

## 2023-03-15 LAB — RAPID URINE DRUG SCREEN, HOSP PERFORMED
Amphetamines: NOT DETECTED
Barbiturates: NOT DETECTED
Benzodiazepines: NOT DETECTED
Cocaine: NOT DETECTED
Opiates: NOT DETECTED
Tetrahydrocannabinol: POSITIVE — AB

## 2023-03-15 LAB — LIPASE, BLOOD: Lipase: 299 U/L — ABNORMAL HIGH (ref 11–51)

## 2023-03-15 LAB — OSMOLALITY: Osmolality: 354 mosm/kg (ref 275–295)

## 2023-03-15 LAB — HIV ANTIBODY (ROUTINE TESTING W REFLEX): HIV Screen 4th Generation wRfx: NONREACTIVE

## 2023-03-15 LAB — ETHANOL: Alcohol, Ethyl (B): <10 mg/dL (ref <10)

## 2023-03-15 MED ORDER — LACTATED RINGERS IV SOLN: INTRAVENOUS | Status: AC

## 2023-03-15 MED ORDER — SODIUM CHLORIDE 0.9 % IV SOLN: 250.0000 mL | INTRAVENOUS | Status: AC | PRN

## 2023-03-15 MED ORDER — INSULIN REGULAR(HUMAN) IN NACL 100-0.9 UT/100ML-% IV SOLN
INTRAVENOUS | Status: DC
  Administered 2023-03-15: 9.5 [IU]/h via INTRAVENOUS
  Administered 2023-03-15: 5 [IU]/h via INTRAVENOUS
  Filled 2023-03-15 (×2): qty 100

## 2023-03-15 MED ORDER — ENOXAPARIN SODIUM 40 MG/0.4ML IJ SOSY
40.0000 mg | PREFILLED_SYRINGE | INTRAMUSCULAR | Status: DC
  Administered 2023-03-15 – 2023-03-16 (×2): 40 mg via SUBCUTANEOUS
  Filled 2023-03-15 (×2): qty 0.4

## 2023-03-15 MED ORDER — DEXTROSE 50 % IV SOLN: 0.0000 mL | INTRAVENOUS | Status: DC | PRN

## 2023-03-15 MED ORDER — SODIUM CHLORIDE 0.9% FLUSH: 3.0000 mL | INTRAVENOUS | Status: DC | PRN

## 2023-03-15 MED ORDER — ACETAMINOPHEN 650 MG RE SUPP
650.0000 mg | Freq: Four times a day (QID) | RECTAL | Status: DC | PRN
Start: 2023-03-15 — End: 2023-03-17

## 2023-03-15 MED ORDER — ACETAMINOPHEN 325 MG PO TABS: 650.0000 mg | ORAL_TABLET | Freq: Four times a day (QID) | ORAL | Status: DC | PRN

## 2023-03-15 MED ORDER — DEXTROSE IN LACTATED RINGERS 5 % IV SOLN: INTRAVENOUS | Status: AC

## 2023-03-15 MED ORDER — LACTATED RINGERS IV BOLUS
20.0000 mL/kg | Freq: Once | INTRAVENOUS | Status: AC
  Administered 2023-03-15: 2000 mL via INTRAVENOUS

## 2023-03-15 MED ORDER — SODIUM CHLORIDE 0.9% FLUSH
3.0000 mL | Freq: Two times a day (BID) | INTRAVENOUS | Status: DC
  Administered 2023-03-16 – 2023-03-17 (×3): 3 mL via INTRAVENOUS

## 2023-03-15 NOTE — ED Provider Triage Note (Signed)
Emergency Medicine Provider Triage Evaluation Note  Terry Jensen , a 28 y.o. male  was evaluated in triage.  Pt complains of nausea, vomiting, dry mouth. Hx of diabetes, except for one dose yesterday, has not taken insulin in months.   Review of Systems  Positive: As above Negative:   Physical Exam  There were no vitals taken for this visit. Gen:   Awake, no distress   Resp:  Normal effort  MSK:   Moves extremities without difficulty  Other:    Medical Decision Making  Medically screening exam initiated at 10:51 AM.  Appropriate orders placed.  Terry Jensen was informed that the remainder of the evaluation will be completed by another provider, this initial triage assessment does not replace that evaluation, and the importance of remaining in the ED until their evaluation is complete.  Workup initiated, CBG in triage = 500. Ordered labs to eval for DKA/HHS including VBG and beta hydroxy   Terry Jensen T, PA-C 03/15/23 1051

## 2023-03-15 NOTE — ED Provider Notes (Signed)
[place] EMERGENCY DEPARTMENT AT [facility] Provider Note   CSN: [id number] Arrival date & time: 03/15/23  1012     History  Chief Complaint  Patient presents with   Diabetic Ketoacidosis    Terry Jensen is a 28 y.o. male.  28 yo M with a chief complaints of not feeling well.  Going on for about 3 days now.  Nausea and vomiting abdominal discomfort.  Unable to keep anything down.  Has been off of his insulin for some time.  He is not really sure why he has not been taking it.  Denies cough congestion or fever.        Home Medications Prior to Admission medications   Medication Sig Start Date End Date Taking? Authorizing Provider  insulin glargine (LANTUS SOLOSTAR) 100 UNIT/ML Solostar Pen ADMINISTER 20 UNITS UNDER THE SKIN DAILY 11/16/20  Yes Carlus Pavlov, MD  insulin lispro (HUMALOG) 100 UNIT/ML KwikPen ADMINISTER 4 TO 8 UNITS UNDER THE SKIN THREE TIMES DAILY 11/16/20  Yes Carlus Pavlov, MD  glucose blood (ONETOUCH VERIO) test strip Use as instructed 3x a day 03/30/18   Carlus Pavlov, MD  Insulin Pen Needle 32G X 4 MM MISC Use 4x a day 10/27/19   Regalado, Belkys A, MD  Lancets (ONETOUCH ULTRASOFT) lancets Use as instructed 3x a day 10/27/19   Regalado, Prentiss Bells, MD      Allergies    Patient has no known allergies.    Review of Systems   Review of Systems  Physical Exam Updated Vital Signs BP 117/89 (BP Location: Left Arm)   Pulse (!) 141   Temp 98.5 F (36.9 C)   Resp (!) 24   Ht 6\' 2"  (1.88 m)   Wt 111.1 kg   SpO2 100%   BMI 31.46 kg/m  Physical Exam Vitals and nursing note reviewed.  Constitutional:      Appearance: He is well-developed.  HENT:     Head: Normocephalic and atraumatic.  Eyes:     Pupils: Pupils are equal, round, and reactive to light.  Neck:     Vascular: No JVD.  Cardiovascular:     Rate and Rhythm: Normal rate and regular rhythm.     Heart sounds: No murmur heard.    No friction rub. No gallop.  Pulmonary:      Effort: No respiratory distress.     Breath sounds: No wheezing.  Abdominal:     General: There is no distension.     Tenderness: There is no abdominal tenderness. There is no guarding or rebound.     Comments: Benign abdominal exam  Musculoskeletal:        General: Normal range of motion.     Cervical back: Normal range of motion and neck supple.  Skin:    Coloration: Skin is not pale.     Findings: No rash.  Neurological:     Mental Status: He is alert and oriented to person, place, and time.  Psychiatric:        Behavior: Behavior normal.     ED Results / Procedures / Treatments   Labs (all labs ordered are listed, but only abnormal results are displayed) Labs Reviewed  CBC - Abnormal; Notable for the following components:      Result Value   WBC 13.7 (*)    RBC 6.05 (*)    Hemoglobin 18.7 (*)    HCT 54.1 (*)    All other components within normal limits  COMPREHENSIVE METABOLIC  PANEL - Abnormal; Notable for the following components:   Sodium 132 (*)    CO2 <7 (*)    Glucose, Bld 545 (*)    BUN 23 (*)    Creatinine, Ser 2.22 (*)    Total Protein 8.9 (*)    AST 14 (*)    Total Bilirubin 1.6 (*)    GFR, Estimated 41 (*)    All other components within normal limits  LIPASE, BLOOD - Abnormal; Notable for the following components:   Lipase 299 (*)    All other components within normal limits  BETA-HYDROXYBUTYRIC ACID - Abnormal; Notable for the following components:   Beta-Hydroxybutyric Acid >8.00 (*)    All other components within normal limits  CBG MONITORING, ED - Abnormal; Notable for the following components:   Glucose-Capillary 500 (*)    All other components within normal limits  I-STAT VENOUS BLOOD GAS, ED - Abnormal; Notable for the following components:   pH, Ven 7.121 (*)    pCO2, Ven 20.1 (*)    Bicarbonate 6.6 (*)    TCO2 7 (*)    Acid-base deficit 20.0 (*)    Sodium 130 (*)    HCT 57.0 (*)    Hemoglobin 19.4 (*)    All other components  within normal limits  I-STAT CHEM 8, ED - Abnormal; Notable for the following components:   Sodium 132 (*)    BUN 27 (*)    Creatinine, Ser 1.60 (*)    Glucose, Bld 531 (*)    TCO2 8 (*)    Hemoglobin 19.7 (*)    HCT 58.0 (*)    All other components within normal limits  CBG MONITORING, ED - Abnormal; Notable for the following components:   Glucose-Capillary >600 (*)    All other components within normal limits  URINALYSIS, ROUTINE W REFLEX MICROSCOPIC  BASIC METABOLIC PANEL  BASIC METABOLIC PANEL  BASIC METABOLIC PANEL  BASIC METABOLIC PANEL  BETA-HYDROXYBUTYRIC ACID  HIV ANTIBODY (ROUTINE TESTING W REFLEX)  LIPID PANEL  HEMOGLOBIN A1C  TSH  OSMOLALITY  URINALYSIS, COMPLETE (UACMP) WITH MICROSCOPIC  RAPID URINE DRUG SCREEN, HOSP PERFORMED  ETHANOL  BETA-HYDROXYBUTYRIC ACID    EKG EKG Interpretation Date/Time:  Wednesday March 15 2023 11:24:04 EST Ventricular Rate:  142 PR Interval:  124 QRS Duration:  78 QT Interval:  286 QTC Calculation: 439 R Axis:   38  Text Interpretation: Sinus tachycardia Right atrial enlargement Borderline ECG No significant change since last tracing Confirmed by Melene Plan [phone]) on 03/15/2023 1:27:38 PM  Radiology Portable chest 1 View Result Date: 03/15/2023 CLINICAL DATA:  10026 Shortness of breath 10026 EXAM: PORTABLE CHEST 1 VIEW COMPARISON:  10/24/2019 FINDINGS: The heart size and mediastinal contours are within normal limits. Both lungs are clear. The visualized skeletal structures are unremarkable. IMPRESSION: No active disease. Electronically Signed   By: Judie Petit.  Shick M.D.   On: 03/15/2023 15:15    Procedures .Critical Care  Performed by: Melene Plan, DO Authorized by: Melene Plan, DO   Critical care provider statement:    Critical care time (minutes):  35   Critical care time was exclusive of:  Separately billable procedures and treating other patients   Critical care was time spent personally by me on the following activities:   Development of treatment plan with patient or surrogate, discussions with consultants, evaluation of patient's response to treatment, examination of patient, ordering and review of laboratory studies, ordering and review of radiographic studies, ordering and performing treatments and interventions,  pulse oximetry, re-evaluation of patient's condition and review of old charts   Care discussed with: admitting provider       Medications Ordered in ED Medications  insulin regular, human (MYXREDLIN) 100 units/ 100 mL infusion (11 Units/hr Intravenous Rate/Dose Change 03/15/23 1545)  lactated ringers infusion (has no administration in time range)  dextrose 5 % in lactated ringers infusion (has no administration in time range)  dextrose 50 % solution 0-50 mL (has no administration in time range)  enoxaparin (LOVENOX) injection 40 mg (has no administration in time range)  acetaminophen (TYLENOL) tablet 650 mg (has no administration in time range)    Or  acetaminophen (TYLENOL) suppository 650 mg (has no administration in time range)  sodium chloride flush (NS) 0.9 % injection 3 mL (has no administration in time range)  sodium chloride flush (NS) 0.9 % injection 3 mL (has no administration in time range)  0.9 %  sodium chloride infusion (has no administration in time range)  lactated ringers bolus 2,222 mL (2,000 mLs Intravenous New Bag/Given 03/15/23 1506)    ED Course/ Medical Decision Making/ A&P                                 Medical Decision Making Amount and/or Complexity of Data Reviewed Labs: ordered.  Risk Prescription drug management. Decision regarding hospitalization.   28 yo M with a chief complaint of not feeling well.  Patient is a type I diabetic and has been off of his insulin for some time.  Feeling bad for about 3 days.  Patient and likely diabetic ketoacidosis and then confirmed on blood work.  pH of 7.12, bicarb of 8 blood sugar 545 with likely elevation of his anion gap.   AKI.  Lipase is elevated, last time he was admitted it was quite a bit higher.  He is no obvious epigastric discomfort on exam.  Unfortunately it has been sometime to get the patient into a room.  Currently still at a triage room.  I will place orders to start the patient on insulin infusion.  IV fluids.  Will discuss with medicine for admission.          Final Clinical Impression(s) / ED Diagnoses Final diagnoses:  Type 1 diabetes mellitus with ketoacidosis without coma [facility])    Rx / DC Orders ED Discharge Orders     None         Melene Plan, DO 03/15/23 1621

## 2023-03-15 NOTE — ED Triage Notes (Signed)
Patient reports feeling weak and inability to hold any PO.  Patient is tachypneic with kussmal respirations.  and lips are dry and cracking. Hx DM  but hasn't taken insulin in months.  Patient is taking in triage.

## 2023-03-15 NOTE — ED Notes (Signed)
Lab called a critical serum Osmolality 354 and Dr Morene Crocker was notified of critical value at 1825. She was also made aware patient heart rate is elevated in 130's.

## 2023-03-15 NOTE — Progress Notes (Signed)
Presented to ED room to assess patient but has not been roomed yet due to ED bed availability. Discussed with Dr. Adela Lank. Patient now in room and being assessed by IMTS team.

## 2023-03-15 NOTE — H&P (Cosign Needed Addendum)
Date: 03/15/2023               Patient Name:  Terry Jensen MRN: [id number]  DOB: 1995-12-25 Age / Sex: 28 y.o., male   PCP: Patient, No Pcp Per              Medical Service: Internal Medicine Teaching Service              Attending Physician: Dr. Mikey Bussing, Marthenia Rolling, DO    First Contact: Filomena Jungling, MS 3 Pager: [phone]  Second Contact: Dr. Manuela Neptune, MD Pager: [phone]  Third Contact Dr. Morene Crocker, MD Pager: [phone]       After Hours (After 5p/  First Contact Pager: [phone]  weekends / holidays): Second Contact Pager: [phone]   Chief Complaint: Weakness, nausea and vomiting  History of Present Illness: Terry Jensen is a 28 y.o. male with past medical history significant for DM presenting with 2 days of worsening nausea, vomiting and weakness. Reports similar symptoms in the past when he has had DKA. He states he has been having weakness, nausea and vomiting every time he drinks for the past two days. At first emesis was white but then it started becoming red and brown. Reports being diagnosed with DM about four years ago and being prescribed insulin but not taking it in months. When asked, he did not provide the reason for not taking prescribed insulin. He denies financial difficulties for obtaining insulin. Denies needing help obtaining insulin, however it seems that he has not established consistent care with a PCP. He cannot elaborate on name or location of PCP.  He does not have HA, dizziness, vision changes. Denies CP, SOB, abdominal pain, difficulty urinating, pain with urination, burning with urination or diarrhea. Denies cough, congestion, nasal drainage, fever, chills or exposure to sick contacts.   In the ED, patient was started on LR and endotool.  Meds: Patient reports being prescribed insulin in the past but does not take any medications on a regular basis.   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 Reports living with his Mom and is independent with ADLs and IADLs. Reports having a PCP but does not know her name or lacation. Denies tobacco, alcohol or other drug use.   Review of Systems: A complete ROS was negative except as per HPI.   Physical Exam: Blood pressure 117/89, pulse (!) 141, temperature 98.5 F (36.9 C), resp. rate (!) 24, height 6\' 2"  (1.88 m), weight 111.1 kg, SpO2 100%.  Physical Exam Eyes:     Pupils: Pupils are equal, round, and reactive to light.  Cardiovascular:     Rate and Rhythm: Regular rhythm. Tachycardia present.     Heart sounds: Normal heart sounds. No murmur heard.    No friction rub. No gallop.  Pulmonary:     Breath sounds: Normal breath sounds. No wheezing or rales.     Comments: Kussmaul respirations Abdominal:     General: Bowel sounds are normal.     Palpations: Abdomen is soft.     Tenderness: There  is no abdominal tenderness. There is no guarding.  Skin:    General: Skin is warm and dry.  Neurological:     Mental Status: He is oriented to person, place, and time. He is lethargic.   CBC    Component Value Date/Time   WBC 13.7 (H) 03/15/2023 1048   RBC 6.05 (H) 03/15/2023 1048   HGB 19.4 (H) 03/15/2023 1113   HGB 19.7 (H) 03/15/2023 1113   HCT 57.0 (H) 03/15/2023 1113   HCT 58.0 (H) 03/15/2023 1113   PLT 196 03/15/2023 1048   MCV 89.4 03/15/2023 1048   MCH 30.9 03/15/2023 1048   MCHC 34.6 03/15/2023 1048   RDW 12.8 03/15/2023 1048   LYMPHSABS 1.6 03/25/2018 0738   MONOABS 1.3 (H) 03/25/2018 0738   EOSABS 0.0 03/25/2018 0738   BASOSABS 0.1 03/25/2018 0738      Latest Ref Rng & Units 03/15/2023   11:13 AM 03/15/2023    10:48 AM 10/27/2019    2:32 AM  CMP  Glucose 70 - 99 mg/dL 161  096  045   BUN 6 - 20 mg/dL 27  23  <5   Creatinine 0.61 - 1.24 mg/dL 4.09  8.11  9.14   Sodium 135 - 145 mmol/L 135 - 145 mmol/L 130    132  132  137   Potassium 3.5 - 5.1 mmol/L 3.5 - 5.1 mmol/L 5.0    5.1  5.1  3.2   Chloride 98 - 111 mmol/L 107  98  105   CO2 22 - 32 mmol/L  <7  19   Calcium 8.9 - 10.3 mg/dL  9.6  8.9   Total Protein 6.5 - 8.1 g/dL  8.9    Total Bilirubin 0.0 - 1.2 mg/dL  1.6    Alkaline Phos 38 - 126 U/L  92    AST 15 - 41 U/L  14    ALT 0 - 44 U/L  21     Beta-Hydroxybutyric Acid: >8.0  EKG: personally reviewed my interpretation is sinus tachycardia, borderline prolonged QT interval QT/QTcB: 319/475 and borderline peaked T waves  CXR: personally reviewed my interpretation is no active disease   Assessment & Plan by Problem: Principal Problem:   DKA (diabetic ketoacidosis) (HCC)  Terry Jensen is a 28 y.o. male with past medical history significant for DM likely type I presenting with 2 days of worsening nausea, vomiting and weakness and admitted to the IMTS for DKA/HHS.  DKA/HHS overlap Per chart review, patient admitted in 2020 for elevated blood sugar and was found to be in DKA along with AKI, hyperkalemia, severe acidosis. He was started on insulin. Thought it was likely type 1 diabetes. Patient seen by endocrinologist where they mentioned they could not make distinction between type I versus type II given he was too glucotoxic and recommended checking insulin production and pancreatic antibodies at later visit. Per chart review, patient has hx of financial challenges with obtaining insulin. Patient appears to be lost to follow-up for about 6 months. He was then admitted for DKA from being w/o insulin for 2 months. He was last seen by endocrinologist a couple days after this admission in September 2021. The endocrinologist continued to think he has type I diabetes, but was not able to confirm  given his recent DKA. He has no other visits charted.  Today, patient is hyperglycemic with anion metabolic acidosis, pH of 7.1 and elevated beta-hydroxybutyric acid on admission. Suspect DKA/HHS 2/2 to insulin non-adherence with hx of  diabetes diagnosis and his hemoconcentration. Unable to confirm why patient has not been using insulin recently. Infectious, intoxication, pancreatitis and ischemic causes of DKA considered. Infectious etiology less likely as patient does not have infectious symptoms on exam and denies having any signs of sickness or feeling ill recently. He complained of SOB but Chest x-ray is unremarkable. WBC elevated likely 2/2 hemoconcentration and dehydration. Patient denies alcohol or drug use, so intoxication less likely. Pancreatitis possible cause as patient reports he has had two episodes of pancreatitis in the past although unable to confirm per chart review. Lipase is >3 x ULN; but he does not have any abdominal pain. Ischemic less likely with denied chest pain. Patient does endorse red/coffee ground emesis. Suspect this is due to DKA from the many vomiting episodes he has been having. Elevated creatinine at 2.22 and then 1.60. He is likely dehydrated given his vomiting and elevated glucose levels. Polycythemia could be reactive. Hyponatremia on admission, but corrected sodium with hyperglycemia 137-140.   - LR bolus  - LR infusion - IV insulin - Trend BMP - Trend beta-hydroxybutyric acid - Ethanol  - Hemoglobin A1c - HIV antibody - Osmolality - UDS - TSH - UA - Lipid panel - Anti-islet cell antibody - Glutamic acid decarboxylase auto abs - IA-2 Autoantibodies - Insulin antibodies - CBC w/ diff tomorrow AM - CMP tomorrow AM  Hypertension  Has history of hypertension but currently not on any meds. Will monitor BP while he is hospitalized.   Hx of Hyperlipidemia  Not on any medication. Will measure lipid panel  Diet: NPO VTE: Lovenox Fluid: LR Code:  Full  Medical Decision Maker: Patient reports he would like his mom, [place] to be his medical decision if he is unable to make his own. He is FULL code  Dispo: Admit patient to Inpatient with expected length of stay greater than 2 midnights.  Signed: Bubba Hales, Medical Student 03/15/2023, 5:16 PM  Pager: [phone]  Manuela Neptune, MD  PGY-1

## 2023-03-16 DIAGNOSIS — E1010 Type 1 diabetes mellitus with ketoacidosis without coma: Secondary | ICD-10-CM | POA: Diagnosis not present

## 2023-03-16 LAB — CBC WITH DIFFERENTIAL/PLATELET
Abs Immature Granulocytes: 0.06 10*3/uL (ref 0.00–0.07)
Basophils Absolute: 0 10*3/uL (ref 0.0–0.1)
Basophils Relative: 0 %
Eosinophils Absolute: 0 10*3/uL (ref 0.0–0.5)
Eosinophils Relative: 0 %
HCT: 45.9 % (ref 39.0–52.0)
Hemoglobin: 16.3 g/dL (ref 13.0–17.0)
Immature Granulocytes: 0 %
Lymphocytes Relative: 17 %
Lymphs Abs: 2.7 10*3/uL (ref 0.7–4.0)
MCH: 30.8 pg (ref 26.0–34.0)
MCHC: 35.5 g/dL (ref 30.0–36.0)
MCV: 86.6 fL (ref 80.0–100.0)
Monocytes Absolute: 1.5 10*3/uL — ABNORMAL HIGH (ref 0.1–1.0)
Monocytes Relative: 10 %
Neutro Abs: 11.2 10*3/uL — ABNORMAL HIGH (ref 1.7–7.7)
Neutrophils Relative %: 73 %
Platelets: 172 10*3/uL (ref 150–400)
RBC: 5.3 MIL/uL (ref 4.22–5.81)
RDW: 12.8 % (ref 11.5–15.5)
WBC: 15.4 10*3/uL — ABNORMAL HIGH (ref 4.0–10.5)
nRBC: 0 % (ref 0.0–0.2)

## 2023-03-16 LAB — GLUCOSE, CAPILLARY
Glucose-Capillary: 170 mg/dL — ABNORMAL HIGH (ref 70–99)
Glucose-Capillary: 157 mg/dL — ABNORMAL HIGH (ref 70–99)
Glucose-Capillary: 139 mg/dL — ABNORMAL HIGH (ref 70–99)
Glucose-Capillary: 179 mg/dL — ABNORMAL HIGH (ref 70–99)
Glucose-Capillary: 172 mg/dL — ABNORMAL HIGH (ref 70–99)
Glucose-Capillary: 152 mg/dL — ABNORMAL HIGH (ref 70–99)
Glucose-Capillary: 141 mg/dL — ABNORMAL HIGH (ref 70–99)
Glucose-Capillary: 159 mg/dL — ABNORMAL HIGH (ref 70–99)
Glucose-Capillary: 267 mg/dL — ABNORMAL HIGH (ref 70–99)
Glucose-Capillary: 154 mg/dL — ABNORMAL HIGH (ref 70–99)

## 2023-03-16 LAB — BASIC METABOLIC PANEL
Anion gap: 19 — ABNORMAL HIGH (ref 5–15)
Anion gap: 13 (ref 5–15)
Anion gap: 12 (ref 5–15)
Anion gap: 12 (ref 5–15)
BUN: 23 mg/dL — ABNORMAL HIGH (ref 6–20)
BUN: 20 mg/dL (ref 6–20)
BUN: 19 mg/dL (ref 6–20)
BUN: 16 mg/dL (ref 6–20)
CO2: 16 mmol/L — ABNORMAL LOW (ref 22–32)
CO2: 19 mmol/L — ABNORMAL LOW (ref 22–32)
CO2: 19 mmol/L — ABNORMAL LOW (ref 22–32)
CO2: 19 mmol/L — ABNORMAL LOW (ref 22–32)
Calcium: 9.2 mg/dL (ref 8.9–10.3)
Calcium: 9.4 mg/dL (ref 8.9–10.3)
Calcium: 9.3 mg/dL (ref 8.9–10.3)
Calcium: 9.1 mg/dL (ref 8.9–10.3)
Chloride: 105 mmol/L (ref 98–111)
Chloride: 107 mmol/L (ref 98–111)
Chloride: 106 mmol/L (ref 98–111)
Chloride: 104 mmol/L (ref 98–111)
Creatinine, Ser: 1.58 mg/dL — ABNORMAL HIGH (ref 0.61–1.24)
Creatinine, Ser: 1.33 mg/dL — ABNORMAL HIGH (ref 0.61–1.24)
Creatinine, Ser: 1.17 mg/dL (ref 0.61–1.24)
Creatinine, Ser: 1.05 mg/dL (ref 0.61–1.24)
GFR, Estimated: >60 mL/min (ref >60)
GFR, Estimated: >60 mL/min (ref >60)
GFR, Estimated: >60 mL/min (ref >60)
GFR, Estimated: >60 mL/min (ref >60)
Glucose, Bld: 185 mg/dL — ABNORMAL HIGH (ref 70–99)
Glucose, Bld: 132 mg/dL — ABNORMAL HIGH (ref 70–99)
Glucose, Bld: 125 mg/dL — ABNORMAL HIGH (ref 70–99)
Glucose, Bld: 193 mg/dL — ABNORMAL HIGH (ref 70–99)
Potassium: 3.7 mmol/L (ref 3.5–5.1)
Potassium: 3.5 mmol/L (ref 3.5–5.1)
Potassium: 3.3 mmol/L — ABNORMAL LOW (ref 3.5–5.1)
Potassium: 3.6 mmol/L (ref 3.5–5.1)
Sodium: 140 mmol/L (ref 135–145)
Sodium: 139 mmol/L (ref 135–145)
Sodium: 137 mmol/L (ref 135–145)
Sodium: 135 mmol/L (ref 135–145)

## 2023-03-16 LAB — COMPREHENSIVE METABOLIC PANEL
ALT: 15 U/L (ref 0–44)
AST: 9 U/L — ABNORMAL LOW (ref 15–41)
Albumin: 4 g/dL (ref 3.5–5.0)
Alkaline Phosphatase: 70 U/L (ref 38–126)
Anion gap: 14 (ref 5–15)
BUN: 21 mg/dL — ABNORMAL HIGH (ref 6–20)
CO2: 19 mmol/L — ABNORMAL LOW (ref 22–32)
Calcium: 9.5 mg/dL (ref 8.9–10.3)
Chloride: 106 mmol/L (ref 98–111)
Creatinine, Ser: 1.39 mg/dL — ABNORMAL HIGH (ref 0.61–1.24)
GFR, Estimated: >60 mL/min (ref >60)
Glucose, Bld: 160 mg/dL — ABNORMAL HIGH (ref 70–99)
Potassium: 3.6 mmol/L (ref 3.5–5.1)
Sodium: 139 mmol/L (ref 135–145)
Total Bilirubin: 0.8 mg/dL (ref 0.0–1.2)
Total Protein: 7.1 g/dL (ref 6.5–8.1)

## 2023-03-16 LAB — PHOSPHORUS: Phosphorus: 2.2 mg/dL — ABNORMAL LOW (ref 2.5–4.6)

## 2023-03-16 LAB — VOLATILES,BLD-ACETONE,ETHANOL,ISOPROP,METHANOL
Acetone, blood: 0.03 g/dL — ABNORMAL HIGH (ref 0.000–0.010)
Ethanol, blood: <0.01 g/dL (ref 0.000–0.010)
Isopropanol, blood: <0.01 g/dL (ref 0.000–0.010)
Methanol, blood: <0.01 g/dL (ref 0.000–0.010)

## 2023-03-16 LAB — CBG MONITORING, ED
Glucose-Capillary: 165 mg/dL — ABNORMAL HIGH (ref 70–99)
Glucose-Capillary: 187 mg/dL — ABNORMAL HIGH (ref 70–99)
Glucose-Capillary: 206 mg/dL — ABNORMAL HIGH (ref 70–99)

## 2023-03-16 LAB — OSMOLALITY: Osmolality: 323 mosm/kg (ref 275–295)

## 2023-03-16 LAB — BETA-HYDROXYBUTYRIC ACID
Beta-Hydroxybutyric Acid: 2.88 mmol/L — ABNORMAL HIGH (ref 0.05–0.27)
Beta-Hydroxybutyric Acid: 2.58 mmol/L — ABNORMAL HIGH (ref 0.05–0.27)

## 2023-03-16 LAB — CK: Total CK: 126 U/L (ref 49–397)

## 2023-03-16 LAB — MAGNESIUM: Magnesium: 2.1 mg/dL (ref 1.7–2.4)

## 2023-03-16 MED ORDER — INSULIN GLARGINE-YFGN 100 UNIT/ML ~~LOC~~ SOLN
24.0000 [IU] | Freq: Every day | SUBCUTANEOUS | Status: DC
  Filled 2023-03-16: qty 0.24

## 2023-03-16 MED ORDER — INSULIN ASPART 100 UNIT/ML IJ SOLN: 0.0000 [IU] | Freq: Every day | INTRAMUSCULAR | Status: DC

## 2023-03-16 MED ORDER — INSULIN ASPART 100 UNIT/ML IJ SOLN
0.0000 [IU] | Freq: Three times a day (TID) | INTRAMUSCULAR | Status: DC
  Administered 2023-03-17 (×2): 5 [IU] via SUBCUTANEOUS

## 2023-03-16 MED ORDER — POTASSIUM CHLORIDE CRYS ER 20 MEQ PO TBCR
40.0000 meq | EXTENDED_RELEASE_TABLET | Freq: Two times a day (BID) | ORAL | Status: DC
  Administered 2023-03-16 (×2): 40 meq via ORAL
  Filled 2023-03-16 (×2): qty 2

## 2023-03-16 MED ORDER — K PHOS MONO-SOD PHOS DI & MONO 155-852-130 MG PO TABS
500.0000 mg | ORAL_TABLET | Freq: Every day | ORAL | Status: AC
  Administered 2023-03-16: 500 mg via ORAL
  Filled 2023-03-16: qty 2

## 2023-03-16 MED ORDER — INSULIN ASPART 100 UNIT/ML IJ SOLN: 0.0000 [IU] | Freq: Three times a day (TID) | INTRAMUSCULAR | Status: DC

## 2023-03-16 MED ORDER — INSULIN ASPART 100 UNIT/ML IJ SOLN
8.0000 [IU] | Freq: Three times a day (TID) | INTRAMUSCULAR | Status: DC
  Administered 2023-03-16 – 2023-03-17 (×3): 8 [IU] via SUBCUTANEOUS

## 2023-03-16 MED ORDER — INSULIN GLARGINE-YFGN 100 UNIT/ML ~~LOC~~ SOLN
24.0000 [IU] | Freq: Once | SUBCUTANEOUS | Status: AC
  Administered 2023-03-16: 24 [IU] via SUBCUTANEOUS
  Filled 2023-03-16: qty 0.24

## 2023-03-16 NOTE — Progress Notes (Signed)
Patient arrived to room 3W35 from ED.  Assessment complete, VS obtained, and Admission database began.

## 2023-03-16 NOTE — Progress Notes (Signed)
Summary: Terry Jensen is a 28 y.o. male with past medical history significant for DM likely type I presenting with 2 days of worsening nausea, vomiting and weakness and admitted to the IMTS for DKA.  HD: 1  Subjective:   Patient seen and evaluated while resting in bed. Patient reports he is better. Denies N/V. Denies abdominal pain or SOB. Patient reports no foreseeable challenges to obtaining insulin after discharge.   Objective:  Vital signs in last 24 hours: Vitals:   03/16/23 0300 03/16/23 0417 03/16/23 0818 03/16/23 1102  BP: 103/64 (!) 140/89 127/83 127/89  Pulse: (!) 119 (!) 112 (!) 105 100  Resp:  18 14 18   Temp:  98.8 F (37.1 C) 98.3 F (36.8 C) 98.8 F (37.1 C)  TempSrc:  Oral Oral Oral  SpO2: 99% 100% 100% 100%  Weight:      Height:       Physical Exam Constitutional:      General: He is not in acute distress.    Appearance: He is not ill-appearing.  Cardiovascular:     Rate and Rhythm: Normal rate and regular rhythm.  Pulmonary:     Effort: Pulmonary effort is normal.     Breath sounds: Normal breath sounds.  Abdominal:     General: Bowel sounds are normal.     Palpations: Abdomen is soft.     Tenderness: There is no abdominal tenderness.  Musculoskeletal:     Right lower leg: No edema.     Left lower leg: No edema.  Skin:    General: Skin is warm and dry.  Neurological:     General: No focal deficit present.     Mental Status: He is alert.    Weight change:   Intake/Output Summary (Last 24 hours) at 03/16/2023 1424 Last data filed at 03/16/2023 0800 Gross per 24 hour  Intake 1972.36 ml  Output 500 ml  Net 1472.36 ml      Latest Ref Rng & Units 03/16/2023    3:25 AM 03/15/2023   11:13 AM 03/15/2023   10:48 AM  CBC  WBC 4.0 - 10.5 K/uL 15.4   13.7   Hemoglobin 13.0 - 17.0 g/dL 04.5  40.9    81.1  91.4   Hematocrit 39.0 - 52.0 % 45.9  57.0    58.0  54.1   Platelets 150 - 400 K/uL 172   196       Latest Ref Rng & Units 03/16/2023    11:43 AM 03/16/2023    9:38 AM 03/16/2023    3:25 AM  CMP  Glucose 70 - 99 mg/dL 782  956  213   BUN 6 - 20 mg/dL 19  20  21    Creatinine 0.61 - 1.24 mg/dL 0.86  5.78  4.69   Sodium 135 - 145 mmol/L 137  139  139   Potassium 3.5 - 5.1 mmol/L 3.3  3.5  3.6   Chloride 98 - 111 mmol/L 106  107  106   CO2 22 - 32 mmol/L 19  19  19    Calcium 8.9 - 10.3 mg/dL 9.3  9.4  9.5   Total Protein 6.5 - 8.1 g/dL   7.1   Total Bilirubin 0.0 - 1.2 mg/dL   0.8   Alkaline Phos 38 - 126 U/L   70   AST 15 - 41 U/L   9   ALT 0 - 44 U/L   15    Osmolality: 323 Calculated osmolality: 294  Beta-hydroxybutyric acid: 2.58  EKG: sinus tachycardia, borderline peaked t waves  UA  Glucose >= 500 Hgb urine dipstick: small Ketones: 80 Protein: 100 Bacteria: Rare RBC/HPF: 0-5  Alcohol <10  Urine Tox Tetrahydrocannabinol (+)  HIV: NR  CK: 126  Lipid Panel Cholesterol: 415 LDL: 297 Triglycerides: 373 VLDL: 75  Assessment/Plan:  Principal Problem:   DKA (diabetic ketoacidosis) (HCC)  Terry Jensen is a 28 y.o. male with past medical history significant for DM likely type I presenting with 2 days of worsening nausea, vomiting and weakness and admitted to the IMTS for DKA on HD 1.   DKA Per chart review, patient admitted in 2020 for elevated blood sugar and was found to be in DKA along with AKI, hyperkalemia, severe acidosis. He was started on insulin. Thought it was likely type 1 diabetes. Patient seen by endocrinologist where they mentioned they could not make distinction between type I versus type II given he was too glucotoxic and recommended checking insulin production and pancreatic antibodies at later visit. Per chart review, patient has hx of financial challenges with obtaining insulin. Patient appears to be lost to follow-up for about 6 months. He was then admitted for DKA from being w/o insulin for 2 months. He was last seen by endocrinologist a couple days after this admission in September  2021. The endocrinologist continued to think he has type I diabetes, but was not able to confirm given his recent DKA. He has no other visits charted.   Today, patient is clinically improved. He is less lethargic on exam. No SOB or N/V. Patient's AG is closing, glucose is decreasing and beta-hydroxybutyric acid levels decreasing. Given patient's anion gap is resolving, will start basal and short-acting insulin and discontinue endo tool. Osmolality gap between calculated and serum osmolality is significant. With anion gap metabolic acidosis, will order acetone, ethanol, isoprop, methanol levels. Considered CT A&P for possible pancreatitis workup as patient reports hx of pancreatitis in the past. Today on exam, patient denies abdominal pain and reports no emesis since yesterday. Think pancreatitis is less likely etiology. K+ decreased to 3.3, will replete. UA revealed Hgb in urine with 0-5 RBC, so CK ordered to assess for rhabdomyolysis. CK WNL. Team discussed with patient the importance of insulin adherence and controlling his blood sugars at bedside. Patient denied barriers to obtaining insulin after discharge.  - Start Semglee 24 units daily - Start novolog 8 units TID before meals - Discontinue endo-tool - Volatiles, Blood (acetone,ethanol,isoprop,methanol) lab ordered - Continue trending BMP q4 hours - Start potassium chloride SA 40 mEq BID   Hypertension  Hypertensive episodes overnight. Normotensive this AM. Will continue to monitor.   Elevated Lipase Hx of Hyperlipidemia  Not on any medication. Lipid panel revealed elevated cholesterol, LDL triglycerides and VLDL. Lipid levels can be affected by DKA, so will consider outpatient follow-up on lipid panels.  - Recommend outpatient follow-up   Diet: Carb modified VTE: Lovenox Fluid: None Code: Full   LOS: 0 days   Bubba Hales, Medical Student 03/16/2023, 2:24 PM

## 2023-03-16 NOTE — Inpatient Diabetes Management (Addendum)
Inpatient Diabetes Program Recommendations  AACE/ADA: New Consensus Statement on Inpatient Glycemic Control (2015)  Target Ranges:  Prepandial:   less than 140 mg/dL      Peak postprandial:   less than 180 mg/dL (1-2 hours)      Critically ill patients:  140 - 180 mg/dL   Lab Results  Component Value Date   GLUCAP 159 (H) 03/16/2023   HGBA1C 12.4 (H) 03/15/2023    Review of Glycemic Control  Diabetes history: DM diagnosed 2020 Outpatient Diabetes medications: None (basal bolus in the past with CBG meter) Current orders for Inpatient glycemic control:  Semglee 24 units qhs Novolog 8 units tid meal coverage  A1c 12.4% on 1/29    Note: insulin gtt rate is over 2 units an hour to maintain current glucose levels. Pt will need more basal insulin.  Inpatient Diabetes Program Recommendations:    -   Increase Semglee to 35 units (0.3 units/kg) -   Add Novolog 0-15 units tid + hs  Betahydroxybutyric acid still elevated 2.58 at time of transition  Spoke with pt at bedside. Pt saw Endocrinology 2 weeks after diagnosis in 2020 and had 3 visits with Dr. Elvera Lennox. After this time pt did not follow up and did not refill his prescriptions to see how he would do. He did not check his glucose but felt fine. Discussed A1c level. Reviewed glucose and A1c goals. Explained to pt he will require insulin at time of discharge. Pt has been on basal bolus in the past with a set meal coverage dose before the meals. Pt reports being on insulin pens. Pt reports having his glucometer still and declines needing a new one. Pt also declines CGM at this time.  Thanks,  Christena Deem RN, MSN, BC-ADM Inpatient Diabetes Coordinator Team Pager [phone] (8a-5p)

## 2023-03-16 NOTE — TOC Initial Note (Signed)
Transition of Care [facility]) - Initial/Assessment Note    Patient Details  Name: Terry Jensen MRN: [id number] Date of Birth: 1996/01/22  Transition of Care [facility]) CM/SW Contact:    Kermit Balo, RN Phone Number: 03/16/2023, 3:22 PM  Clinical Narrative:                  Pt is from home with his mother. He denies issues with transportation. He does admit to insulin non-compliance and states he is going to take his medications as prescribed after this visit. He doesn't want to have to return to the hospital. PCP: pt wants to stay with [facility]--he was not interested in being set up with another MD. Pt still on insulin gtt.  TOC following  Expected Discharge Plan: Home/Self Care Barriers to Discharge: Continued Medical Work up   Patient Goals and CMS Choice            Expected Discharge Plan and Services   Discharge Planning Services: CM Consult   Living arrangements for the past 2 months: Single Family Home                                      Prior Living Arrangements/Services Living arrangements for the past 2 months: Single Family Home Lives with:: Parents (Mom) Patient language and need for interpreter reviewed:: Yes Do you feel safe going back to the place where you live?: Yes            Criminal Activity/Legal Involvement Pertinent to Current Situation/Hospitalization: No - Comment as needed  Activities of Daily Living   ADL Screening (condition at time of admission) Independently performs ADLs?: Yes (appropriate for developmental age) Is the patient deaf or have difficulty hearing?: No Does the patient have difficulty seeing, even when wearing glasses/contacts?: No Does the patient have difficulty concentrating, remembering, or making decisions?: No  Permission Sought/Granted                  Emotional Assessment Appearance:: Appears stated age Attitude/Demeanor/Rapport: Engaged Affect (typically observed):  Accepting Orientation: : Oriented to Self, Oriented to Place, Oriented to  Time, Oriented to Situation   Psych Involvement: No (comment)  Admission diagnosis:  DKA (diabetic ketoacidosis) (HCC) [E11.10] Type 1 diabetes mellitus with ketoacidosis without coma (HCC) [E10.10] Patient Active Problem List   Diagnosis Date Noted   Class 1 obesity 10/29/2019   AKI (acute kidney injury) (HCC) 10/25/2019   Hyperbilirubinemia 10/25/2019   Leucocytosis 10/25/2019   Diabetes mellitus with ketoacidosis, uncontrolled (HCC) 03/30/2018   DKA (diabetic ketoacidosis) (HCC) 03/25/2018   Sports physical 09/14/2011   PCP:  Patient, No Pcp Per Pharmacy:   [id number] [place] Otto, [place] - [address] AT [facility] OF [address] [place] [address] Phone: [phone] Fax: [phone]     Social Drivers of Health (SDOH) Social History: SDOH Screenings   Food Insecurity: No Food Insecurity (03/16/2023)  Housing: Low Risk  (03/16/2023)  Transportation Needs: No Transportation Needs (03/16/2023)  Utilities: Not At Risk (03/16/2023)  Depression (PHQ2-9): Low Risk  (04/30/2018)  Tobacco Use: Low Risk  (03/15/2023)   SDOH Interventions:     Readmission Risk Interventions     No data to display

## 2023-03-16 NOTE — Evaluation (Signed)
Physical Therapy Brief Evaluation and Discharge Note Patient Details Name: Terry Jensen MRN: [id number] DOB: 19-Dec-1995 Today's Date: 03/16/2023   History of Present Illness  28 y.o. male presenting 1/29 with 2 days of worsening nausea, vomiting and weakness. Reports similar symptoms in the past when he has had DKA. CBG 545 Admitted for treatment of DKA HTN, elevated lipase PMH: DM I  Clinical Impression  Pt lives with mother in single story home with level entry. Pt is limited in safe mobility by increase in HR to 165 bpm with standing. Otherwise, pt is completely independent in all mobility and balance.  Pt has no PT or equipment needs at discharge. If pt experiences change in mobility please reorder therapy service. PT signing off.      PT Assessment Patient does not need any further PT services  Assistance Needed at Discharge  None    Equipment Recommendations None recommended by PT     Precautions/Restrictions Precautions Precautions: None Restrictions Weight Bearing Restrictions Per Provider Order: No        Mobility  Bed Mobility   Supine/Sidelying to sit: Independent Sit to supine/sidelying: Independent General bed mobility comments: able to come to sitting at EoB from flattened bed with no assist from bed rail  Transfers Overall transfer level: Independent Equipment used: None               General transfer comment: good power up and self steady    Ambulation/Gait Ambulation/Gait assistance: Independent Gait Distance (Feet): 18 Feet Assistive device: None Gait Pattern/deviations: WFL(Within Functional Limits) Gait Speed: Pace WFL General Gait Details: only walked to door and back due to elevated HR with standing, strong steady gait no assist needed        Balance Overall balance assessment: Independent (able to pull on pants in standing)                        Pertinent Vitals/Pain PT - Brief Vital Signs All Vital Signs Stable:  No Exception to Vital Signs Stable: HR jumps to 165 bpm with standing, RN made aware Pain Assessment Pain Assessment: 0-10     Home Living Family/patient expects to be discharged to:: Private residence Living Arrangements: Parent Available Help at Discharge: Family;Available 24 hours/day (initially) Home Environment: Level entry   Home Equipment: None        Prior Function Level of Independence: Independent Comments: works as Education administrator    UE/LE Assessment   UE ROM/Strength/Tone/Coordination: [employer]    LE ROM/Strength/Tone/Coordination: [employer]      Communication   Communication Communication: No apparent difficulties (just very flat and soft spoken)     Cognition Overall Cognitive Status: Appears within functional limits for tasks assessed/performed        Assessment/Plan       PT Visit Diagnosis Difficulty in walking, not elsewhere classified (R26.2) (increased HR)    No Skilled PT All education completed;Patient at baseline level of functioning;Patient is independent with all acitivity/mobility;Patient will have necessary level of assist by caregiver at discharge    AMPAC 6 Clicks Help needed turning from your back to your side while in a flat bed without using bedrails?: None Help needed moving from lying on your back to sitting on the side of a flat bed without using bedrails?: None Help needed moving to and from a bed to a chair (including a wheelchair)?: None Help needed standing up from a chair using your arms (e.g., wheelchair  or bedside chair)?: None Help needed to walk in hospital room?: None Help needed climbing 3-5 steps with a railing? : None 6 Click Score: 24      End of Session   Activity Tolerance: Treatment limited secondary to medical complications (Comment) (elevated HR) Patient left: in bed (lab in room) Nurse Communication: Mobility status;Other (comment) (increase in HR with coming to standing) PT Visit Diagnosis:  Difficulty in walking, not elsewhere classified (R26.2) (increased HR)     Time: 1610-9604 PT Time Calculation (min) (ACUTE ONLY): 14 min  Charges:   PT Evaluation $PT Eval Low Complexity: 1 Low      Burna Atlas B. Beverely Risen PT, DPT Acute Rehabilitation Services Please use secure chat or  Call Office [phone]   [facility]  03/16/2023, 9:44 AM

## 2023-03-16 NOTE — Progress Notes (Signed)
OT Cancellation Note  Patient Details Name: Terry Jensen MRN: [id number] DOB: December 01, 1995   Cancelled Treatment:    Reason Eval/Treat Not Completed: OT screened, no needs identified, will sign off (Discussed with PT.)  [facility] 03/16/2023, 9:38 AM Luisa Dago, OT/L   Acute OT Clinical Specialist Acute Rehabilitation Services Pager [phone] Office [phone]

## 2023-03-16 NOTE — Progress Notes (Signed)
Date and time results received: 03/16/23 0430  Test: Serum Osmolality  Critical Value: 323  Name of Provider Notified: Merrilee Jansky

## 2023-03-17 ENCOUNTER — Other Ambulatory Visit (HOSPITAL_COMMUNITY): Payer: Self-pay

## 2023-03-17 ENCOUNTER — Telehealth (HOSPITAL_COMMUNITY): Payer: Self-pay | Admitting: Pharmacy Technician

## 2023-03-17 DIAGNOSIS — E1010 Type 1 diabetes mellitus with ketoacidosis without coma: Secondary | ICD-10-CM | POA: Diagnosis not present

## 2023-03-17 LAB — GLUCOSE, CAPILLARY
Glucose-Capillary: 174 mg/dL — ABNORMAL HIGH (ref 70–99)
Glucose-Capillary: 203 mg/dL — ABNORMAL HIGH (ref 70–99)
Glucose-Capillary: 225 mg/dL — ABNORMAL HIGH (ref 70–99)
Glucose-Capillary: 208 mg/dL — ABNORMAL HIGH (ref 70–99)

## 2023-03-17 LAB — BASIC METABOLIC PANEL
Anion gap: 14 (ref 5–15)
BUN: 11 mg/dL (ref 6–20)
CO2: 16 mmol/L — ABNORMAL LOW (ref 22–32)
Calcium: 8.8 mg/dL — ABNORMAL LOW (ref 8.9–10.3)
Chloride: 104 mmol/L (ref 98–111)
Creatinine, Ser: 1.07 mg/dL (ref 0.61–1.24)
GFR, Estimated: >60 mL/min (ref >60)
Glucose, Bld: 231 mg/dL — ABNORMAL HIGH (ref 70–99)
Potassium: 3.4 mmol/L — ABNORMAL LOW (ref 3.5–5.1)
Sodium: 134 mmol/L — ABNORMAL LOW (ref 135–145)

## 2023-03-17 LAB — CBC
HCT: 41.2 % (ref 39.0–52.0)
Hemoglobin: 14.6 g/dL (ref 13.0–17.0)
MCH: 30.4 pg (ref 26.0–34.0)
MCHC: 35.4 g/dL (ref 30.0–36.0)
MCV: 85.8 fL (ref 80.0–100.0)
Platelets: 119 10*3/uL — ABNORMAL LOW (ref 150–400)
RBC: 4.8 MIL/uL (ref 4.22–5.81)
RDW: 12.8 % (ref 11.5–15.5)
WBC: 8 10*3/uL (ref 4.0–10.5)
nRBC: 0 % (ref 0.0–0.2)

## 2023-03-17 LAB — GLUTAMIC ACID DECARBOXYLASE AUTO ABS: Glutamic Acid Decarb Ab: <5 U/mL (ref 0.0–5.0)

## 2023-03-17 LAB — ANTI-ISLET CELL ANTIBODY: Pancreatic Islet Cell Antibody: NEGATIVE

## 2023-03-17 LAB — MAGNESIUM: Magnesium: 2 mg/dL (ref 1.7–2.4)

## 2023-03-17 MED ORDER — POTASSIUM CHLORIDE CRYS ER 20 MEQ PO TBCR
40.0000 meq | EXTENDED_RELEASE_TABLET | Freq: Once | ORAL | Status: AC
  Administered 2023-03-17: 40 meq via ORAL
  Filled 2023-03-17: qty 2

## 2023-03-17 MED ORDER — INSULIN PEN NEEDLE 32G X 4 MM MISC
0 refills | Status: DC
  Filled 2023-03-17: qty 100, 25d supply, fill #0

## 2023-03-17 MED ORDER — INSULIN GLARGINE-YFGN 100 UNIT/ML ~~LOC~~ SOLN
30.0000 [IU] | Freq: Every day | SUBCUTANEOUS | Status: DC
  Filled 2023-03-17: qty 0.3

## 2023-03-17 MED ORDER — INSULIN GLARGINE 100 UNIT/ML SOLOSTAR PEN
30.0000 [IU] | PEN_INJECTOR | Freq: Every day | SUBCUTANEOUS | 0 refills | Status: DC
  Filled 2023-03-17: qty 9, 30d supply, fill #0

## 2023-03-17 MED ORDER — INSULIN LISPRO (1 UNIT DIAL) 100 UNIT/ML (KWIKPEN)
PEN_INJECTOR | SUBCUTANEOUS | 0 refills | Status: DC
  Filled 2023-03-17: qty 15, 30d supply, fill #0

## 2023-03-17 NOTE — Hospital Course (Addendum)
DKA Per chart review, patient admitted in 2020 for elevated blood sugar and was found to be in DKA along with AKI, hyperkalemia, severe acidosis. He was started on insulin. Thought it was likely type 1 diabetes. Patient seen by endocrinologist where they mentioned they could not make distinction between type I versus type II given he was too glucotoxic and recommended checking insulin production and pancreatic antibodies at later visit. Per chart review, patient has hx of financial challenges with obtaining insulin. Patient appears to be lost to follow-up for about 6 months. He was then admitted for DKA from being w/o insulin for 2 months. He was last seen by endocrinologist a couple days after this admission in September 2021. The endocrinologist continued to think he has type I diabetes, but was not able to confirm given his recent DKA. He has no other visits charted.   On admission, patient presented with nausea, coffee-ground emesis and weakness for 2 days. He was found to be hyperglycemic with anion metabolic acidosis, pH of 7.1 and have elevated beta-hydroxybutyric acid >8.0. Suspected DKA 2/2 to insulin non-adherence with hx of diabetes diagnosis and his hemoconcentration. Infectious, intoxication, pancreatitis and ischemic causes of DKA considered. Pancreatitis was higher on the differential as patient reports he has had two episodes of pancreatitis in the past although unable to confirm per chart review. Lipase was >3 x ULN; but he did not endorse abdominal pain during admission. He was started on fluids and endo-tool. During his admission, his anion gap closed, glucose decreased and beta hydroxybutyric acid decreased. He was initially transitioned to just under 0.5u/kg with 24 units Semglee + 8 units novolag TID. His Semglee was increased to 30 units + 8 units of Novolog TID with meals. Patient denied needing new glucometer or being interested in CGM. At discharge, patient provided instructions on  insulin regimen and encouraged to follow-up with endocrinologist or establish care with IMTS.  During admission, antibodies to assess for type I DM were obtained and still in process at discharge. Recommend outpatient follow-up on these labs.   HTN Has history of hypertension but currently not on any meds. BP stable during admission. Recommend outpatient follow-up on HTN.   Elevated Lipase Hx of Hyperlipidemia Not on any medication. Lipid panel revealed elevated cholesterol, LDL triglycerides and VLDL. Lipid levels can be affected by DKA, so recommend outpatient follow-up on lipid panels. Consider statin if elevated.

## 2023-03-17 NOTE — TOC Transition Note (Signed)
Transition of Care [facility]) - Discharge Note   Patient Details  Name: Terry Jensen MRN: [id number] Date of Birth: May 06, 1995  Transition of Care [facility] CM/SW Contact:  Kermit Balo, RN Phone Number: 03/17/2023, 2:57 PM   Clinical Narrative:     Pt is discharging home with self care. No further needs per TOC.   Final next level of care: Home/Self Care Barriers to Discharge: No Barriers Identified   Patient Goals and CMS Choice            Discharge Placement                       Discharge Plan and Services Additional resources added to the After Visit Summary for     Discharge Planning Services: CM Consult                                 Social Drivers of Health (SDOH) Interventions SDOH Screenings   Food Insecurity: No Food Insecurity (03/16/2023)  Housing: Low Risk  (03/16/2023)  Transportation Needs: No Transportation Needs (03/16/2023)  Utilities: Not At Risk (03/16/2023)  Depression (PHQ2-9): Low Risk  (04/30/2018)  Tobacco Use: Low Risk  (03/15/2023)     Readmission Risk Interventions     No data to display

## 2023-03-17 NOTE — Discharge Summary (Cosign Needed)
Name: Terry Jensen MRN: [id number] DOB: February 11, 1996 28 y.o. PCP: Patient, No Pcp Per  Date of Admission: 03/15/2023 10:26 AM Date of Discharge: 03/17/2023  Attending Physician: Dr. Cleda Daub  DISCHARGE DIAGNOSIS:  Primary Problem: DKA (diabetic ketoacidosis) [facility])   Hospital Problems: Principal Problem:   DKA (diabetic ketoacidosis) (HCC)    DISCHARGE MEDICATIONS:   Allergies as of 03/17/2023   No Known Allergies      Medication List     STOP taking these medications    glucose blood test strip Commonly known as: OneTouch Verio   onetouch ultrasoft lancets       TAKE these medications    HumaLOG KwikPen 100 UNIT/ML KwikPen Generic drug: insulin lispro Inject 8 units three times a day before each meal. See sliding scale in patient instructions for additional units after each meal What changed: See the new instructions.   Insupen Pen Needles 32G X 4 MM Misc Generic drug: Insulin Pen Needle Use 4x a day   Lantus SoloStar 100 UNIT/ML Solostar Pen Generic drug: insulin glargine Inject 30 Units into the skin at bedtime. What changed: See the new instructions.        DISPOSITION AND FOLLOW-UP:  JOHNWESLEY Jensen was discharged from [facility] in Stable condition. At the hospital follow up visit please address:  Follow-up Recommendations: Consults: Diabetes coordinator Labs:  lipid panel, Basic Metabolic Profile, Blood Sugar, and CBC  Medications: Insulin, may need statin  - Close follow up with PCP if patient is unable to see endocrinologist soon after discharge - PCP to follow-up on HTN management and lipid panel as patient's inpatient lipid panel elevated  - Close follow up with endocrinology for DM management and follow-up on DM antibody testing   Follow-up Appointments:  Pt to schedule FU with Endocrinologist. [facility] number provided. Declines FU with PCP at this time.   HOSPITAL COURSE:  Patient Summary: DKA Per chart review,  patient admitted in 2020 for elevated blood sugar and was found to be in DKA along with AKI, hyperkalemia, severe acidosis. He was started on insulin. Thought it was likely type 1 diabetes. Patient seen by endocrinologist where they mentioned they could not make distinction between type I versus type II given he was too glucotoxic and recommended checking insulin production and pancreatic antibodies at later visit. Per chart review, patient has hx of financial challenges with obtaining insulin. Patient appears to be lost to follow-up for about 6 months. He was then admitted for DKA from being w/o insulin for 2 months. He was last seen by endocrinologist a couple days after this admission in September 2021. The endocrinologist continued to think he has type I diabetes, but was not able to confirm given his recent DKA. He has no other visits charted.   On admission, patient presented with nausea, coffee-ground emesis and weakness for 2 days. He was found to be hyperglycemic with anion metabolic acidosis, pH of 7.1 and have elevated beta-hydroxybutyric acid >8.0. Suspected DKA 2/2 to insulin non-adherence with hx of diabetes diagnosis and his hemoconcentration. Infectious, intoxication, pancreatitis and ischemic causes of DKA considered. Pancreatitis was higher on the differential as patient reports he has had two episodes of pancreatitis in the past although unable to confirm per chart review. Lipase was >3 x ULN; but he did not endorse abdominal pain during admission. He was started on fluids and endo-tool. During his admission, his anion gap closed, glucose decreased and beta hydroxybutyric acid decreased. He was initially transitioned  to just under 0.5u/kg with 24 units Semglee + 8 units novolag TID. His Semglee was increased to 30 units + 8 units of Novolog TID with meals. Patient denied needing new glucometer or being interested in CGM. At discharge, patient provided instructions on insulin regimen and  encouraged to follow-up with endocrinologist or establish care with IMTS.  During admission, antibodies to assess for type I DM were obtained and still in process at discharge. Recommend outpatient follow-up on these labs.   HTN Has history of hypertension but currently not on any meds. BP stable during admission. Recommend outpatient follow-up on HTN.   Elevated Lipase Hx of Hyperlipidemia Not on any medication. Lipid panel revealed elevated cholesterol, LDL triglycerides and VLDL. Lipid levels can be affected by DKA, so recommend outpatient follow-up on lipid panels. Consider statin if elevated.    DISCHARGE INSTRUCTIONS:   Discharge Instructions     Call MD for:  difficulty breathing, headache or visual disturbances   Complete by: As directed    Call MD for:  extreme fatigue   Complete by: As directed    Call MD for:  hives   Complete by: As directed    Call MD for:  persistant dizziness or light-headedness   Complete by: As directed    Call MD for:  persistant nausea and vomiting   Complete by: As directed    Call MD for:  severe uncontrolled pain   Complete by: As directed    Call MD for:  temperature >100.4   Complete by: As directed    Diet Carb Modified   Complete by: As directed    Discharge instructions   Complete by: As directed    Mr. Lampkins,   Thank you for entrusting [place] with your care. You were hospitalized due to elevated blood sugar levels and were found to be in diabetic ketoacidosis, a severe complication of elevated blood sugar levels. We started you on fluids and insulin intravenously and transitioned you to insulin given through an injection. Please find the following instructions below as you prepare to leave the hospital:   Diabetes: - Inject 30 units of Semglee at bedtime - Inject 8 units of Novolog three times a day of before meals. - Check your blood surgar before eat - Inject the following units of Novolog based on your blood sugar reading  below: CBG < 70: 0  CBG 70 - 120: 0 units  CBG 121 - 150: 2 units  CBG 151 - 200: 3 units  CBG 201 - 250: 5 units  CBG 251 - 300: 8 units  CBG 301 - 350: 11 units  CBG 351 - 400: 15 units  CBG > 400 15 units and call you primary care doctor or endocrinologist   - We recommended that you establish care with a primary care doctor. You said your endocrinologist will take care of your diabetes management. Please see your endocrinologist within the next week to follow up on your hospitalization. You are welcome to establish care with the [facility] at [facility]. The phone number to schedule an appointment is [phone].   If you start to feel shaky, dizzy or having difficulty concentrating, you may be experiencing low blood sugar. Please check your blood sugar. We have attached instructions for when your blood sugar is below 70. If your blood sugar is less than 70, please eat or drink something with sugar in it right away. The food or drink should have 15 grams  of a fast-acting carbohydrate (carb). Options include:  4 oz (120 mL) of fruit juice.  4 oz (120 mL) of soda (not diet soda).  A few pieces of hard candy. Check food labels to see how many pieces to eat.  1 Tbsp (15 mL) of sugar or honey.  4 glucose tablets.  1 tube of glucose gel.  After you eat or drink something, please wait 15 minutes to check your blood sugar again. If your blood sugar is still at or below 70 mg/dL (3.9 mmol/L), take 15 grams of a carb again.  If your blood sugar doesn't go above 70 mg/dL (3.9 mmol/L) after 3 tries, get help right away.  After your blood sugar goes back to normal, eat a meal or a snack within 1 hour.  Always keep 15 grams of a fast-acting carb with you. This could be:  o 4 glucose tablets.  o A few pieces of hard candy.  o 1 Tbsp (15 mL) of honey or sugar.  o 1 tube of glucose gel.  If you start to feel nauseated, experience vomiting or weakness,  have increased thirst or urination, please check your blood sugar as you may be in diabetic ketoacidosis. We have attached information about diabetic ketoacidosis. If your blood sugar is greater than 200, please call your primary care provider or endocrinologist.   Elevated lipid levels: You have elevated lipid (fats) in your blood. Having increased levels can make you more at risk for heart and vascular disease overtime, such as predispose you to heart attacks and strokes. Lipid levels can be affected by diabetic ketoacidosis, which is what you were admitted into the hospital for. You will need to get these re-measured when you are out of the hospital to see if you would need to take medication for this.   Sincerely, Your Internal Medicine care team   Increase activity slowly   Complete by: As directed        SUBJECTIVE:   Wants to follow with Endocrinology for his diabetes. Declines visit at [facility]. Wants endo to do primary care needs.   Discharge Vitals:   BP 119/86 (BP Location: Right Arm)   Pulse 100   Temp 98.7 F (37.1 C) (Oral)   Resp (!) 21   Ht 6\' 2"  (1.88 m)   Wt 111.1 kg   SpO2 100%   BMI 31.46 kg/m   OBJECTIVE:  Physical Exam Constitutional:      General: He is not in acute distress.    Appearance: He is not ill-appearing or toxic-appearing.  Cardiovascular:     Rate and Rhythm: Normal rate and regular rhythm.     Heart sounds: No murmur heard.    No friction rub. No gallop.  Pulmonary:     Effort: Pulmonary effort is normal. No respiratory distress.     Breath sounds: No wheezing, rhonchi or rales.  Abdominal:     General: There is no distension.     Palpations: Abdomen is soft.     Tenderness: There is no abdominal tenderness. There is no guarding or rebound.  Musculoskeletal:     Right lower leg: No edema.     Left lower leg: No edema.  Neurological:     Mental Status: He is alert and oriented to person, place, and time.  Psychiatric:        Mood and  Affect: Affect is flat.     Pertinent Labs, Studies, and Procedures:     Latest Ref Rng &  Units 03/17/2023    7:05 AM 03/16/2023    3:25 AM 03/15/2023   11:13 AM  CBC  WBC 4.0 - 10.5 K/uL 8.0  15.4    Hemoglobin 13.0 - 17.0 g/dL 16.1  09.6  04.5    40.9   Hematocrit 39.0 - 52.0 % 41.2  45.9  57.0    58.0   Platelets 150 - 400 K/uL 119  172         Latest Ref Rng & Units 03/17/2023    7:05 AM 03/16/2023    4:32 PM 03/16/2023   11:43 AM  CMP  Glucose 70 - 99 mg/dL 811  914  782   BUN 6 - 20 mg/dL 11  16  19    Creatinine 0.61 - 1.24 mg/dL 9.56  2.13  0.86   Sodium 135 - 145 mmol/L 134  135  137   Potassium 3.5 - 5.1 mmol/L 3.4  3.6  3.3   Chloride 98 - 111 mmol/L 104  104  106   CO2 22 - 32 mmol/L 16  19  19    Calcium 8.9 - 10.3 mg/dL 8.8  9.1  9.3     Portable chest 1 View Result Date: 03/15/2023 CLINICAL DATA:  10026 Shortness of breath 10026 EXAM: PORTABLE CHEST 1 VIEW COMPARISON:  10/24/2019 FINDINGS: The heart size and mediastinal contours are within normal limits. Both lungs are clear. The visualized skeletal structures are unremarkable. IMPRESSION: No active disease. Electronically Signed   By: Judie Petit.  Shick M.D.   On: 03/15/2023 15:15     Signed: Manuela Neptune, MD Internal Medicine Resident, PGY-1 [facility]  Pager: [phone] 4:00 PM, 03/17/2023

## 2023-03-17 NOTE — Discharge Instructions (Addendum)
[name],   Thank you for entrusting [place] with your care. You were hospitalized due to elevated blood sugar levels and were found to be in diabetic ketoacidosis, a severe complication of elevated blood sugar levels. We started you on fluids and insulin intravenously and transitioned you to insulin given through an injection. Please find the following instructions below as you prepare to leave the hospital:   Diabetes: - Inject 30 units of Semglee at bedtime - Inject 8 units of Novolog three times a day of before meals. - Check your blood surgar before eat - Inject the following units of Novolog based on your blood sugar reading below: CBG < 70: 0   CBG 70 - 120: 0 units   CBG 121 - 150: 2 units   CBG 151 - 200: 3 units   CBG 201 - 250: 5 units   CBG 251 - 300: 8 units   CBG 301 - 350: 11 units   CBG 351 - 400: 15 units   CBG > 400 15 units and call you primary care doctor or endocrinologist    - We recommended that you establish care with a primary care doctor. You said your endocrinologist will take care of your diabetes management. Please see your endocrinologist within the next week to follow up on your hospitalization. You are welcome to establish care with the [facility] at [facility]. The phone number to schedule an appointment is [phone].   If you start to feel shaky, dizzy or having difficulty concentrating, you may be experiencing low blood sugar. Please check your blood sugar. We have attached instructions for when your blood sugar is below 70. If your blood sugar is less than 70, please eat or drink something with sugar in it right away. The food or drink should have 15 grams of a fast-acting carbohydrate (carb). Options include: 4 oz (120 mL) of fruit juice. 4 oz (120 mL) of soda (not diet soda). A few pieces of hard candy. Check food labels to see how many pieces to eat. 1 Tbsp (15 mL) of sugar or honey. 4 glucose tablets. 1 tube of  glucose gel.  After you eat or drink something, please wait 15 minutes to check your blood sugar again. If your blood sugar is still at or below 70 mg/dL (3.9 mmol/L), take 15 grams of a carb again. If your blood sugar doesn't go above 70 mg/dL (3.9 mmol/L) after 3 tries, get help right away. After your blood sugar goes back to normal, eat a meal or a snack within 1 hour. Always keep 15 grams of a fast-acting carb with you. This could be: 4 glucose tablets. A few pieces of hard candy. 1 Tbsp (15 mL) of honey or sugar. 1 tube of glucose gel.  If you start to feel nauseated, experience vomiting or weakness, have increased thirst or urination, please check your blood sugar as you may be in diabetic ketoacidosis. We have attached information about diabetic ketoacidosis. If your blood sugar is greater than 200, please call your primary care provider or endocrinologist.   Elevated lipid levels: You have elevated lipid (fats) in your blood. Having increased levels can make you more at risk for heart and vascular disease overtime, such as predispose you to heart attacks and strokes. Lipid levels can be affected by diabetic ketoacidosis, which is what you were admitted into the hospital for. You will need to get these re-measured when you are out of the hospital  to see if you would need to take medication for this.   Sincerely, Your Internal Medicine care team

## 2023-03-17 NOTE — Progress Notes (Signed)
Patient has AVS Asked if had any questions  escorted to [facility] pharmacy to pickup meds.  Discharged via private automobile

## 2023-03-17 NOTE — Telephone Encounter (Signed)
Patient Product/process development scientist completed.    The patient is insured through [facility]. Patient has [employer], may use a copay card, and/or apply for patient assistance if available.    Ran test claim for Lantus Pen and the current 30 day co-pay is $0.00.  Ran test claim for Humalog Kwikpen and the current 30 day co-pay is $0.00.  This test claim was processed through [facility]- copay amounts may vary at other pharmacies due to pharmacy/plan contracts, or as the patient moves through the different stages of their insurance plan.     Roland Earl, CPHT Pharmacy Technician III Certified Patient Advocate [facility] Pharmacy Patient Advocate Team Direct Number: [phone]  Fax: [phone]

## 2023-03-24 LAB — INSULIN ANTIBODIES, BLOOD: Insulin Antibodies, Human: <5 uU/mL

## 2023-03-25 LAB — IA-2 AUTOANTIBODIES: IA-2 Autoantibodies: <7.5 U/mL

## 2023-09-14 ENCOUNTER — Ambulatory Visit (HOSPITAL_COMMUNITY)

## 2023-09-15 ENCOUNTER — Encounter (HOSPITAL_COMMUNITY): Payer: Self-pay | Admitting: *Deleted

## 2023-09-15 ENCOUNTER — Ambulatory Visit (HOSPITAL_COMMUNITY)
Admission: EM | Admit: 2023-09-15 | Discharge: 2023-09-15 | Disposition: A | Discharge status: Home / Self Care | Attending: Emergency Medicine | Admitting: Emergency Medicine

## 2023-09-15 ENCOUNTER — Ambulatory Visit (HOSPITAL_COMMUNITY)

## 2023-09-15 DIAGNOSIS — Z113 Encounter for screening for infections with a predominantly sexual mode of transmission: Secondary | ICD-10-CM | POA: Insufficient documentation

## 2023-09-15 LAB — HIV ANTIBODY (ROUTINE TESTING W REFLEX): HIV Screen 4th Generation wRfx: NONREACTIVE

## 2023-09-15 NOTE — ED Provider Notes (Signed)
 [facility]    CSN: [id number] Arrival date & time: 09/15/23  1038      History   Chief Complaint Chief Complaint  Patient presents with   SEXUALLY TRANSMITTED DISEASE    HPI PHI AVANS is a 28 y.o. male.   Since requesting STD testing.  Patient denies any known exposures to STDs.  Patient denies penile discharge, penile/testicular swelling or pain, penile lesions, dysuria, abdominal pain, flank pain, and fever.  The history is provided by the patient and medical records.    Past Medical History:  Diagnosis Date   Diabetes mellitus without complication (HCC)    Hypertension     Patient Active Problem List   Diagnosis Date Noted   Class 1 obesity 10/29/2019   AKI (acute kidney injury) (HCC) 10/25/2019   Hyperbilirubinemia 10/25/2019   Leucocytosis 10/25/2019   Diabetes mellitus with ketoacidosis, uncontrolled (HCC) 03/30/2018   DKA (diabetic ketoacidosis) (HCC) 03/25/2018   Sports physical 09/14/2011    History reviewed. No pertinent surgical history.     Home Medications    Prior to Admission medications   Medication Sig Start Date End Date Taking? Authorizing Provider  insulin  glargine (LANTUS ) 100 UNIT/ML Solostar Pen Inject 30 Units into the skin at bedtime. 03/17/23  Yes Alexander-Savino, [place], MD  insulin  lispro (HUMALOG ) 100 UNIT/ML KwikPen Inject 8 units three times a day before each meal. See sliding scale in patient instructions for additional units after each meal 03/17/23  Yes Alexander-Savino, [place], MD  Insulin  Pen Needle 32G X 4 MM MISC Use 4x a day 03/17/23   Volney Leash, MD    Family History Family History  Problem Relation Age of Onset   Cancer Other    Diabetes Other    Sudden death Neg Hx    Heart attack Neg Hx     Social History Social History   Tobacco Use   Smoking status: Never   Smokeless tobacco: Never  Vaping Use   Vaping status: Every Day  Substance Use Topics   Alcohol use: Yes    Drug use: Never     Allergies   Patient has no known allergies.   Review of Systems Review of Systems  Per HPI  Physical Exam Triage Vital Signs ED Triage Vitals  Encounter Vitals Group     BP 09/15/23 1045 136/85     Girls Systolic BP Percentile --      Girls Diastolic BP Percentile --      Boys Systolic BP Percentile --      Boys Diastolic BP Percentile --      Pulse Rate 09/15/23 1045 80     Resp 09/15/23 1045 16     Temp 09/15/23 1045 97.6 F (36.4 C)     Temp Source 09/15/23 1045 Oral     SpO2 09/15/23 1045 97 %     Weight --      Height --      Head Circumference --      Peak Flow --      Pain Score 09/15/23 1044 0     Pain Loc --      Pain Education --      Exclude from Growth Chart --    No data found.  Updated Vital Signs BP 136/85 (BP Location: Left Arm)   Pulse 80   Temp 97.6 F (36.4 C) (Oral)   Resp 16   SpO2 97%   Visual Acuity Right Eye Distance:   Left Eye Distance:  Bilateral Distance:    Right Eye Near:   Left Eye Near:    Bilateral Near:     Physical Exam Vitals and nursing note reviewed.  Constitutional:      General: He is awake. He is not in acute distress.    Appearance: Normal appearance. He is well-developed and well-groomed. He is not ill-appearing.  Genitourinary:    Comments: Exam deferred Neurological:     Mental Status: He is alert.  Psychiatric:        Behavior: Behavior is cooperative.      UC Treatments / Results  Labs (all labs ordered are listed, but only abnormal results are displayed) Labs Reviewed  HIV ANTIBODY (ROUTINE TESTING W REFLEX)  RPR  CYTOLOGY, (ORAL, ANAL, URETHRAL) ANCILLARY ONLY    EKG   Radiology No results found.  Procedures Procedures (including critical care time)  Medications Ordered in UC Medications - No data to display  Initial Impression / Assessment and Plan / UC Course  I have reviewed the triage vital signs and the nursing notes.  Pertinent labs & imaging  results that were available during my care of the patient were reviewed by me and considered in my medical decision making (see chart for details).     Patient is overall well-appearing.  Vitals are stable.  GU exam deferred.  Patient perform self swab for STD.  HIV and RPR ordered.  Discussed follow-up and return precautions Final Clinical Impressions(s) / UC Diagnoses   Final diagnoses:  Screening for STD (sexually transmitted disease)     Discharge Instructions      Your results will come back over the next few days and someone will call if results are positive and require treatment.  Return here as needed.    ED Prescriptions   None    PDMP not reviewed this encounter.   Johnie Flaming A, NP 09/15/23 1052

## 2023-09-15 NOTE — ED Triage Notes (Signed)
 Pt states he would like STI check up, denies any sx. He would like cyto and blood work today.

## 2023-09-15 NOTE — Discharge Instructions (Signed)
 Your results will come back over the next few days and someone will call if results are positive and require treatment.  Return here as needed.

## 2023-09-16 LAB — RPR: RPR Ser Ql: NONREACTIVE

## 2023-09-18 LAB — CYTOLOGY, (ORAL, ANAL, URETHRAL) ANCILLARY ONLY
Chlamydia: NEGATIVE
Comment: NEGATIVE
Comment: NEGATIVE
Comment: NORMAL
Neisseria Gonorrhea: NEGATIVE
Trichomonas: NEGATIVE

## 2023-11-07 ENCOUNTER — Encounter (HOSPITAL_COMMUNITY): Payer: Self-pay

## 2023-11-07 ENCOUNTER — Emergency Department (HOSPITAL_COMMUNITY)

## 2023-11-07 ENCOUNTER — Inpatient Hospital Stay (HOSPITAL_COMMUNITY)
Admission: EM | Admit: 2023-11-07 | Discharge: 2023-11-10 | DRG: 638 | Disposition: A | Discharge status: Home / Self Care | Attending: Family Medicine | Admitting: Family Medicine

## 2023-11-07 ENCOUNTER — Other Ambulatory Visit: Payer: Self-pay

## 2023-11-07 DIAGNOSIS — E861 Hypovolemia: Secondary | ICD-10-CM | POA: Diagnosis present

## 2023-11-07 DIAGNOSIS — R000 Tachycardia, unspecified: Secondary | ICD-10-CM | POA: Diagnosis present

## 2023-11-07 DIAGNOSIS — B370 Candidal stomatitis: Secondary | ICD-10-CM | POA: Diagnosis present

## 2023-11-07 DIAGNOSIS — E875 Hyperkalemia: Secondary | ICD-10-CM | POA: Diagnosis present

## 2023-11-07 DIAGNOSIS — E1010 Type 1 diabetes mellitus with ketoacidosis without coma: Secondary | ICD-10-CM | POA: Diagnosis not present

## 2023-11-07 DIAGNOSIS — Z794 Long term (current) use of insulin: Secondary | ICD-10-CM

## 2023-11-07 DIAGNOSIS — E876 Hypokalemia: Secondary | ICD-10-CM | POA: Diagnosis not present

## 2023-11-07 DIAGNOSIS — I10 Essential (primary) hypertension: Secondary | ICD-10-CM | POA: Diagnosis present

## 2023-11-07 DIAGNOSIS — Z833 Family history of diabetes mellitus: Secondary | ICD-10-CM

## 2023-11-07 DIAGNOSIS — R1310 Dysphagia, unspecified: Secondary | ICD-10-CM | POA: Diagnosis present

## 2023-11-07 DIAGNOSIS — J029 Acute pharyngitis, unspecified: Secondary | ICD-10-CM

## 2023-11-07 DIAGNOSIS — E1110 Type 2 diabetes mellitus with ketoacidosis without coma: Secondary | ICD-10-CM | POA: Diagnosis present

## 2023-11-07 DIAGNOSIS — J0430 Supraglottitis, unspecified, without obstruction: Secondary | ICD-10-CM | POA: Diagnosis present

## 2023-11-07 DIAGNOSIS — Z91199 Patient's noncompliance with other medical treatment and regimen due to unspecified reason: Secondary | ICD-10-CM

## 2023-11-07 DIAGNOSIS — D72829 Elevated white blood cell count, unspecified: Secondary | ICD-10-CM | POA: Diagnosis present

## 2023-11-07 DIAGNOSIS — N179 Acute kidney failure, unspecified: Secondary | ICD-10-CM | POA: Diagnosis present

## 2023-11-07 DIAGNOSIS — I959 Hypotension, unspecified: Secondary | ICD-10-CM | POA: Diagnosis present

## 2023-11-07 LAB — CBC WITH DIFFERENTIAL/PLATELET
Abs Immature Granulocytes: 0.15 K/uL — ABNORMAL HIGH (ref 0.00–0.07)
Basophils Absolute: 0.1 K/uL (ref 0.0–0.1)
Basophils Relative: 0 %
Eosinophils Absolute: 0 K/uL (ref 0.0–0.5)
Eosinophils Relative: 0 %
HCT: 52.9 % — ABNORMAL HIGH (ref 39.0–52.0)
Hemoglobin: 17.3 g/dL — ABNORMAL HIGH (ref 13.0–17.0)
Immature Granulocytes: 1 %
Lymphocytes Relative: 7 %
Lymphs Abs: 1.3 K/uL (ref 0.7–4.0)
MCH: 31.3 pg (ref 26.0–34.0)
MCHC: 32.7 g/dL (ref 30.0–36.0)
MCV: 95.7 fL (ref 80.0–100.0)
Monocytes Absolute: 1.3 K/uL — ABNORMAL HIGH (ref 0.1–1.0)
Monocytes Relative: 7 %
Neutro Abs: 16.8 K/uL — ABNORMAL HIGH (ref 1.7–7.7)
Neutrophils Relative %: 85 %
Platelets: 225 K/uL (ref 150–400)
RBC: 5.53 MIL/uL (ref 4.22–5.81)
RDW: 12 % (ref 11.5–15.5)
WBC: 19.7 K/uL — ABNORMAL HIGH (ref 4.0–10.5)
nRBC: 0 % (ref 0.0–0.2)

## 2023-11-07 LAB — I-STAT CHEM 8, ED
BUN: 17 mg/dL (ref 6–20)
Calcium, Ion: 1.27 mmol/L (ref 1.15–1.40)
Chloride: 110 mmol/L (ref 98–111)
Creatinine, Ser: 1.2 mg/dL (ref 0.61–1.24)
Glucose, Bld: 479 mg/dL — ABNORMAL HIGH (ref 70–99)
HCT: 51 % (ref 39.0–52.0)
Hemoglobin: 17.3 g/dL — ABNORMAL HIGH (ref 13.0–17.0)
Potassium: 6 mmol/L — ABNORMAL HIGH (ref 3.5–5.1)
Sodium: 135 mmol/L (ref 135–145)
TCO2: 10 mmol/L — ABNORMAL LOW (ref 22–32)

## 2023-11-07 LAB — I-STAT VENOUS BLOOD GAS, ED
Acid-base deficit: 20 mmol/L — ABNORMAL HIGH (ref 0.0–2.0)
Bicarbonate: 7.9 mmol/L — ABNORMAL LOW (ref 20.0–28.0)
Calcium, Ion: 1.27 mmol/L (ref 1.15–1.40)
HCT: 50 % (ref 39.0–52.0)
Hemoglobin: 17 g/dL (ref 13.0–17.0)
O2 Saturation: 98 %
Potassium: 6 mmol/L — ABNORMAL HIGH (ref 3.5–5.1)
Sodium: 134 mmol/L — ABNORMAL LOW (ref 135–145)
TCO2: 9 mmol/L — ABNORMAL LOW (ref 22–32)
pCO2, Ven: 25.9 mmHg — ABNORMAL LOW (ref 44–60)
pH, Ven: 7.093 — CL (ref 7.25–7.43)
pO2, Ven: 132 mmHg — ABNORMAL HIGH (ref 32–45)

## 2023-11-07 LAB — COMPREHENSIVE METABOLIC PANEL WITH GFR
ALT: 16 U/L (ref 0–44)
AST: 17 U/L (ref 15–41)
Albumin: 4.4 g/dL (ref 3.5–5.0)
Alkaline Phosphatase: 107 U/L (ref 38–126)
Anion gap: 29 — ABNORMAL HIGH (ref 5–15)
BUN: 14 mg/dL (ref 6–20)
CO2: 8 mmol/L — ABNORMAL LOW (ref 22–32)
Calcium: 10.2 mg/dL (ref 8.9–10.3)
Chloride: 94 mmol/L — ABNORMAL LOW (ref 98–111)
Creatinine, Ser: 2.15 mg/dL — ABNORMAL HIGH (ref 0.61–1.24)
GFR, Estimated: 42 mL/min — ABNORMAL LOW (ref >60)
Glucose, Bld: 502 mg/dL (ref 70–99)
Potassium: 5 mmol/L (ref 3.5–5.1)
Sodium: 131 mmol/L — ABNORMAL LOW (ref 135–145)
Total Bilirubin: 2.2 mg/dL — ABNORMAL HIGH (ref 0.0–1.2)
Total Protein: 9.5 g/dL — ABNORMAL HIGH (ref 6.5–8.1)

## 2023-11-07 LAB — CBG MONITORING, ED
Glucose-Capillary: 498 mg/dL — ABNORMAL HIGH (ref 70–99)
Glucose-Capillary: 367 mg/dL — ABNORMAL HIGH (ref 70–99)

## 2023-11-07 LAB — BETA-HYDROXYBUTYRIC ACID: Beta-Hydroxybutyric Acid: 7.62 mmol/L — ABNORMAL HIGH (ref 0.05–0.27)

## 2023-11-07 LAB — GROUP A STREP BY PCR: Group A Strep by PCR: NOT DETECTED

## 2023-11-07 MED ORDER — POTASSIUM CHLORIDE 10 MEQ/100ML IV SOLN
10.0000 meq | INTRAVENOUS | Status: AC
  Administered 2023-11-07 – 2023-11-08 (×2): 10 meq via INTRAVENOUS
  Filled 2023-11-07 (×2): qty 100

## 2023-11-07 MED ORDER — SODIUM CHLORIDE 0.9 % IV SOLN
3.0000 g | Freq: Once | INTRAVENOUS | Status: AC
  Administered 2023-11-08: 3 g via INTRAVENOUS
  Filled 2023-11-07: qty 8

## 2023-11-07 MED ORDER — IOHEXOL 350 MG/ML SOLN
75.0000 mL | Freq: Once | INTRAVENOUS | Status: AC | PRN
  Administered 2023-11-07: 75 mL via INTRAVENOUS

## 2023-11-07 MED ORDER — LACTATED RINGERS IV SOLN: INTRAVENOUS | Status: AC

## 2023-11-07 MED ORDER — ONDANSETRON HCL 4 MG/2ML IJ SOLN
4.0000 mg | Freq: Once | INTRAMUSCULAR | Status: AC
  Administered 2023-11-08: 4 mg via INTRAVENOUS
  Filled 2023-11-07: qty 2

## 2023-11-07 MED ORDER — INSULIN REGULAR(HUMAN) IN NACL 100-0.9 UT/100ML-% IV SOLN
INTRAVENOUS | Status: AC
  Administered 2023-11-07: 13 [IU]/h via INTRAVENOUS
  Administered 2023-11-08: 5 [IU]/h via INTRAVENOUS
  Filled 2023-11-07 (×2): qty 100

## 2023-11-07 MED ORDER — DEXTROSE 50 % IV SOLN: 0.0000 mL | INTRAVENOUS | Status: DC | PRN

## 2023-11-07 MED ORDER — SODIUM CHLORIDE 0.9 % IV SOLN: 1.0000 g | Freq: Once | INTRAVENOUS | Status: DC

## 2023-11-07 MED ORDER — LACTATED RINGERS IV BOLUS
20.0000 mL/kg | Freq: Once | INTRAVENOUS | Status: AC
  Administered 2023-11-07: 1996 mL via INTRAVENOUS

## 2023-11-07 MED ORDER — LACTATED RINGERS IV BOLUS
1000.0000 mL | Freq: Once | INTRAVENOUS | Status: AC
  Administered 2023-11-07: 1000 mL via INTRAVENOUS

## 2023-11-07 MED ORDER — DEXAMETHASONE SODIUM PHOSPHATE 10 MG/ML IJ SOLN
8.0000 mg | Freq: Three times a day (TID) | INTRAMUSCULAR | Status: DC
  Administered 2023-11-08 (×2): 8 mg via INTRAVENOUS
  Filled 2023-11-07 (×2): qty 1
  Filled 2023-11-07: qty 0.8

## 2023-11-07 MED ORDER — DEXTROSE IN LACTATED RINGERS 5 % IV SOLN: INTRAVENOUS | Status: AC

## 2023-11-07 NOTE — ED Provider Notes (Signed)
 [place] EMERGENCY DEPARTMENT AT [facility] Provider Note   CSN: [id number] Arrival date & time: 11/07/23  2039     Patient presents with: Hyperglycemia   Terry Jensen is a 28 y.o. male.    Hyperglycemia  Patient is a 28 year old male with a past medical history significant for diabetes type 1, history of DKA, hypertension  Patient presents emergency room today with complaints of sore throat and polyuria polydipsia  He informs me that he does not reliably give himself insulin  he states that he does this sometimes he has not given him insulin  himself insulin  today.SABRA  He denies any chest pain difficulty breathing he does endorse muffled voice.       Prior to Admission medications   Medication Sig Start Date End Date Taking? Authorizing Provider  insulin  glargine (LANTUS ) 100 UNIT/ML Solostar Pen Inject 30 Units into the skin at bedtime. 03/17/23   Volney, Washington, MD  insulin  lispro (HUMALOG ) 100 UNIT/ML KwikPen Inject 8 units three times a day before each meal. See sliding scale in patient instructions for additional units after each meal 03/17/23   Alexander-Savino, Washington, MD  Insulin  Pen Needle 32G X 4 MM MISC Use 4x a day 03/17/23   Alexander-Savino, [place], MD    Allergies: Patient has no known allergies.    Review of Systems  Updated Vital Signs BP 117/77   Pulse (!) 45   Temp 97.7 F (36.5 C)   Resp 19   Ht 6' 2 (1.88 m)   Wt 99.8 kg   SpO2 100%   BMI 28.25 kg/m   Physical Exam Vitals and nursing note reviewed.  Constitutional:      General: He is not in acute distress.    Appearance: He is ill-appearing.  HENT:     Head: Normocephalic and atraumatic.     Nose: Nose normal.     Mouth/Throat:     Mouth: Mucous membranes are moist.     Comments: Discolored posterior pharynx with erythema, uvula midline, muffled phonation  2 finger trismus Eyes:     General: No scleral icterus. Neck:     Comments: Cervical  lymphadenopathy No stridor Full range of motion of neck    Cardiovascular:     Rate and Rhythm: Normal rate and regular rhythm.     Pulses: Normal pulses.     Heart sounds: Normal heart sounds.  Pulmonary:     Effort: Pulmonary effort is normal. No respiratory distress.     Breath sounds: No wheezing.     Comments: No stridor Abdominal:     Palpations: Abdomen is soft.     Tenderness: There is no abdominal tenderness. There is no guarding or rebound.  Musculoskeletal:     Cervical back: Normal range of motion.     Right lower leg: No edema.     Left lower leg: No edema.  Skin:    General: Skin is warm and dry.     Capillary Refill: Capillary refill takes less than 2 seconds.  Neurological:     Mental Status: He is alert. Mental status is at baseline.  Psychiatric:        Mood and Affect: Mood normal.        Behavior: Behavior normal.     (all labs ordered are listed, but only abnormal results are displayed) Labs Reviewed  BETA-HYDROXYBUTYRIC ACID - Abnormal; Notable for the following components:      Result Value   Beta-Hydroxybutyric Acid 7.62 (*)  All other components within normal limits  CBC WITH DIFFERENTIAL/PLATELET - Abnormal; Notable for the following components:   WBC 19.7 (*)    Hemoglobin 17.3 (*)    HCT 52.9 (*)    Neutro Abs 16.8 (*)    Monocytes Absolute 1.3 (*)    Abs Immature Granulocytes 0.15 (*)    All other components within normal limits  COMPREHENSIVE METABOLIC PANEL WITH GFR - Abnormal; Notable for the following components:   Sodium 131 (*)    Chloride 94 (*)    CO2 8 (*)    Glucose, Bld 502 (*)    Creatinine, Ser 2.15 (*)    Total Protein 9.5 (*)    Total Bilirubin 2.2 (*)    GFR, Estimated 42 (*)    Anion gap 29 (*)    All other components within normal limits  CBG MONITORING, ED - Abnormal; Notable for the following components:   Glucose-Capillary 498 (*)    All other components within normal limits  I-STAT VENOUS BLOOD GAS, ED -  Abnormal; Notable for the following components:   pH, Ven 7.093 (*)    pCO2, Ven 25.9 (*)    pO2, Ven 132 (*)    Bicarbonate 7.9 (*)    TCO2 9 (*)    Acid-base deficit 20.0 (*)    Sodium 134 (*)    Potassium 6.0 (*)    All other components within normal limits  I-STAT CHEM 8, ED - Abnormal; Notable for the following components:   Potassium 6.0 (*)    Glucose, Bld 479 (*)    TCO2 10 (*)    Hemoglobin 17.3 (*)    All other components within normal limits  CBG MONITORING, ED - Abnormal; Notable for the following components:   Glucose-Capillary 367 (*)    All other components within normal limits  GROUP A STREP BY PCR  BETA-HYDROXYBUTYRIC ACID  BETA-HYDROXYBUTYRIC ACID  BETA-HYDROXYBUTYRIC ACID  URINALYSIS, ROUTINE W REFLEX MICROSCOPIC  BASIC METABOLIC PANEL WITH GFR  BASIC METABOLIC PANEL WITH GFR  BASIC METABOLIC PANEL WITH GFR  BASIC METABOLIC PANEL WITH GFR  BETA-HYDROXYBUTYRIC ACID  BASIC METABOLIC PANEL WITH GFR    EKG: EKG Interpretation Date/Time:  Tuesday November 07 2023 20:53:12 EDT Ventricular Rate:  158 PR Interval:  98 QRS Duration:  82 QT Interval:  306 QTC Calculation: 496 R Axis:   -23  Text Interpretation: Sinus tachycardia with short PR Otherwise normal ECG Confirmed by Franklyn Gills [phone]) on 11/07/2023 10:22:20 PM  Radiology: CT Soft Tissue Neck W Contrast Result Date: 11/07/2023 CLINICAL DATA:  Initial evaluation for acute soft tissue infection, sore throat. EXAM: CT NECK WITH CONTRAST TECHNIQUE: Multidetector CT imaging of the neck was performed using the standard protocol following the bolus administration of intravenous contrast. RADIATION DOSE REDUCTION: This exam was performed according to the departmental dose-optimization program which includes automated exposure control, adjustment of the mA and/or kV according to patient size and/or use of iterative reconstruction technique. CONTRAST:  75mL OMNIPAQUE  IOHEXOL  350 MG/ML SOLN COMPARISON:  None  Available. FINDINGS: Pharynx and larynx: Oral cavity within normal limits. No acute abnormality about the dentition. Palatine tonsils symmetric and within normal limits. Nasopharynx normal. Mild thickening and edematous changes about the epiglottis and aryepiglottic folds, suspicious for mild and/or early acute supraglottitis. Associated small layering retropharyngeal effusion, likely reactive. No loculated collection. Small volume layering secretions noted within the hypopharynx. Remainder of the supraglottic larynx and glottis within normal limits. Subglottic airway clear. Salivary glands: Salivary glands including  the parotid and submandibular glands are within normal limits. Thyroid : Normal. Lymph nodes: Mildly prominent upper cervical lymph nodes, largest of which measures 1.2 cm a right level 2, presumably reactive. Vascular: Normal intravascular enhancement seen throughout the neck. Limited intracranial: Unremarkable. Visualized orbits: Unremarkable. Mastoids and visualized paranasal sinuses: Visualized paranasal sinuses are clear. Visualized mastoids and middle ear cavities are well pneumatized and free of fluid. Skeleton: No discrete or worrisome osseous lesions. Upper chest: No other acute finding. Other: None. IMPRESSION: 1. Mild thickening and edematous changes about the epiglottis and aryepiglottic folds, consistent with acute supraglottitis. Associated small layering retropharyngeal effusion, likely reactive. No loculated or drainable fluid collections. 2. Mildly prominent upper cervical lymph nodes, presumably reactive. Electronically Signed   By: Morene Hoard M.D.   On: 11/07/2023 23:31     .Critical Care  Performed by: Neldon Hamp RAMAN, PA Authorized by: Neldon Hamp RAMAN, PA   Critical care provider statement:    Critical care time (minutes):  35   Critical care time was exclusive of:  Separately billable procedures and treating other patients and teaching time   Critical care was  time spent personally by me on the following activities:  Development of treatment plan with patient or surrogate, review of old charts, re-evaluation of patient's condition, pulse oximetry, ordering and review of radiographic studies, ordering and review of laboratory studies, ordering and performing treatments and interventions, obtaining history from patient or surrogate, examination of patient and evaluation of patient's response to treatment   Care discussed with: admitting provider      Medications Ordered in the ED  insulin  regular, human (MYXREDLIN ) 100 units/ 100 mL infusion (13 Units/hr Intravenous New Bag/Given 11/07/23 2319)  dextrose  5 % in lactated ringers  infusion (0 mLs Intravenous Hold 11/07/23 2324)  dextrose  50 % solution 0-50 mL (has no administration in time range)  potassium chloride  10 mEq in 100 mL IVPB (10 mEq Intravenous New Bag/Given 11/07/23 2316)  lactated ringers  infusion (has no administration in time range)  ondansetron  (ZOFRAN ) injection 4 mg (has no administration in time range)  dexamethasone  (DECADRON ) injection 8 mg (has no administration in time range)  Ampicillin -Sulbactam (UNASYN ) 3 g in sodium chloride  0.9 % 100 mL IVPB (has no administration in time range)  lactated ringers  bolus 1,996 mL (1,996 mLs Intravenous New Bag/Given 11/07/23 2141)  iohexol  (OMNIPAQUE ) 350 MG/ML injection 75 mL (75 mLs Intravenous Contrast Given 11/07/23 2307)    Clinical Course as of 11/08/23 0036  Wed Nov 08, 2023  0007 Dr. Harlene Hefty [WF]    Clinical Course User Index [WF] Neldon Hamp RAMAN, PA                                 Medical Decision Making Amount and/or Complexity of Data Reviewed Labs: ordered. Radiology: ordered.  Risk Prescription drug management. Decision regarding hospitalization.   This patient presents to the ED for concern of sore throat, this involves a number of treatment options, and is a complaint that carries with it a moderate to high risk  of complications and morbidity. A differential diagnosis was considered for the patient's symptoms which is discussed below:   Peritonsillar, retropharyngeal abscess, epiglottitis, supraglottitis, strep throat, mononucleosis   Co morbidities: Discussed in HPI   Brief History:  Patient is a 28 year old male with a past medical history significant for diabetes type 1, history of DKA, hypertension  Patient presents emergency room today with complaints  of sore throat and polyuria polydipsia  He informs me that he does not reliably give himself insulin  he states that he does this sometimes he has not given him insulin  himself insulin  today.SABRA  He denies any chest pain difficulty breathing he does endorse muffled voice.    EMR reviewed including pt PMHx, past surgical history and past visits to ER.   See HPI for more details   Lab Tests:   I ordered and independently interpreted labs. Labs notable for Leukocytosis of 19.7 with left shift, CMP with hyperglycemia blood sugar of 502 anion gap is 29 likely secondary to DKA.  VBG with pH of 7.093 bicarb 7.9 also consistent with DKA and patient is ketotic with serum ketones of 7.62  Urine pending at this time strep negative  Imaging Studies:  Abnormal findings. I personally reviewed all imaging studies. Imaging notable for Reactive lymph nodes and concern for supraglottitis   Cardiac Monitoring:  The patient was maintained on a cardiac monitor.  I personally viewed and interpreted the cardiac monitored which showed an underlying rhythm of: Sinus tachycardia cardia  EKG non-ischemic   Medicines ordered:  I ordered medication including Zofran , 3 L of lactated Ringer 's, Decadron , insulin , Unasyn  for throat infection, hyperglycemia, hydration, vomiting Reevaluation of the patient after these medicines showed that the patient improved I have reviewed the patients home medicines and have made adjustments as needed   Critical  Interventions:   Decadron , broad-spectrum antibiotics in the form of Unasyn , IV fluids, treatment of DKA   Consults/Attending Physician   I requested consultation with ENT Dr. Elena Larry,  and discussed lab and imaging findings as well as pertinent plan - they recommend: That I admit to the hospitalist.  I discussed with them that there is some voice muffling and the patient is drooling at times.  She assures me that it is probably nothing and I requested she look at the images. I asked for specific recommendations for abx and Dr. Soldatova recommended Unasyn . Dr. Soldatova also recommended 8mg  of decadron  every 8 hours and recommends hospitalist admission.  I am specifically concerned about this patient's condition given that he is actively in DKA with significant acidosis has slightly decreased alertness because of his acidosis presumably and is at high risk for worsening airway compromise.  ENT does not feel that there is any acute surgical emergency which I believe is likely true as well however I am concerned that he may need more close airway monitoring.  I consulted Dr. Harlene Na of critical care who will have APP evaluate.  If ICU declines admission will admit to hospitalist service.  Reevaluation:  After the interventions noted above I re-evaluated patient and found that they have :stayed the same   Social Determinants of Health:      Problem List / ED Course:  Patient in DKA likely secondary to supraglottitis I discussed with ENT who gave some recommendations and will see patient in consultation tomorrow.  Discussed with critical care who will evaluate patient.  Patient care handed off to oncoming team for follow-up on critical care's recommendations.   Dispostion:  After consideration of the diagnostic results and the patients response to treatment, I feel that the patent would benefit from admission   Final diagnoses:  Diabetic ketoacidosis without coma  associated with type 1 diabetes mellitus (HCC)  Supraglottitis without airway obstruction    ED Discharge Orders     None          Neldon Hamp RAMAN,  PA 11/08/23 0037    Franklyn Sid SAILOR, MD 11/09/23 [phone]

## 2023-11-07 NOTE — ED Triage Notes (Signed)
 Pt in with reported hyperglycemia - hx of DM1. Pt reports frequent n/v and polyuria x 2 days

## 2023-11-07 NOTE — ED Provider Triage Note (Signed)
 Emergency Medicine Provider Triage Evaluation Note  Terry Jensen , a 28 y.o. male  was evaluated in triage.  Patient with history of insulin -dependent diabetes.  He developed sore throat yesterday.  Sore throat became severe, he was unable to eat and drink.  He has muffled voice.  Presents with tachycardia.  Reports vomiting.  Review of Systems  Positive: Sore throat Negative: Fever  Physical Exam  BP (!) 134/93 (BP Location: Right Arm)   Pulse (!) 151   Temp 97.7 F (36.5 C)   Resp 20   Ht 6' 2 (1.88 m)   Wt 99.8 kg   SpO2 100%   BMI 28.25 kg/m  Gen:   Awake, no distress   Resp:  Normal effort  MSK:   Moves extremities without difficulty  Other:  Tachycardia, dry mucous membranes, no obvious peritonsillar abscess however posterior oropharynx is deep red in color with thrush noted on the tongue  Medical Decision Making  Medically screening exam initiated at 8:59 PM.  Appropriate orders placed.  ALAIN DESCHENE was informed that the remainder of the evaluation will be completed by another provider, this initial triage assessment does not replace that evaluation, and the importance of remaining in the ED until their evaluation is complete.  Patient is ill and needs to have a room in the main ED.  Nursing aware.   Desiderio Chew, PA-C 11/07/23 2102

## 2023-11-07 NOTE — ED Notes (Signed)
 Patient transported to CT

## 2023-11-07 NOTE — ED Notes (Signed)
 PA made aware of  HR, pt declines cp or SOB

## 2023-11-07 NOTE — ED Provider Notes (Incomplete)
 [name] Provider Note   CSN: [id number] Arrival date & time: 11/07/23  2039     Patient presents with: Hyperglycemia   Terry Jensen is a 28 y.o. male.  {Add pertinent medical, surgical, social history, OB history to HPI:32947}  Hyperglycemia      Prior to Admission medications   Medication Sig Start Date End Date Taking? Authorizing Provider  insulin  glargine (LANTUS ) 100 UNIT/ML Solostar Pen Inject 30 Units into the skin at bedtime. 03/17/23   Volney, [place], MD  insulin  lispro (HUMALOG ) 100 UNIT/ML KwikPen Inject 8 units three times a day before each meal. See sliding scale in patient instructions for additional units after each meal 03/17/23   Alexander-Savino, [place], MD  Insulin  Pen Needle 32G X 4 MM MISC Use 4x a day 03/17/23   Alexander-Savino, [place], MD    Allergies: Patient has no known allergies.    Review of Systems  Updated Vital Signs BP 117/77   Pulse (!) 45   Temp 97.7 F (36.5 C)   Resp 19   Ht 6' 2 (1.88 m)   Wt 99.8 kg   SpO2 100%   BMI 28.25 kg/m   Physical Exam  (all labs ordered are listed, but only abnormal results are displayed) Labs Reviewed  BETA-HYDROXYBUTYRIC ACID - Abnormal; Notable for the following components:      Result Value   Beta-Hydroxybutyric Acid 7.62 (*)    All other components within normal limits  CBC WITH DIFFERENTIAL/PLATELET - Abnormal; Notable for the following components:   WBC 19.7 (*)    Hemoglobin 17.3 (*)    HCT 52.9 (*)    Neutro Abs 16.8 (*)    Monocytes Absolute 1.3 (*)    Abs Immature Granulocytes 0.15 (*)    All other components within normal limits  COMPREHENSIVE METABOLIC PANEL WITH GFR - Abnormal; Notable for the following components:   Sodium 131 (*)    Chloride 94 (*)    CO2 8 (*)    Glucose, Bld 502 (*)    Creatinine, Ser 2.15 (*)    Total Protein 9.5 (*)    Total Bilirubin 2.2 (*)    GFR, Estimated 42 (*)    Anion gap 29  (*)    All other components within normal limits  CBG MONITORING, ED - Abnormal; Notable for the following components:   Glucose-Capillary 498 (*)    All other components within normal limits  I-STAT VENOUS BLOOD GAS, ED - Abnormal; Notable for the following components:   pH, Ven 7.093 (*)    pCO2, Ven 25.9 (*)    pO2, Ven 132 (*)    Bicarbonate 7.9 (*)    TCO2 9 (*)    Acid-base deficit 20.0 (*)    Sodium 134 (*)    Potassium 6.0 (*)    All other components within normal limits  I-STAT CHEM 8, ED - Abnormal; Notable for the following components:   Potassium 6.0 (*)    Glucose, Bld 479 (*)    TCO2 10 (*)    Hemoglobin 17.3 (*)    All other components within normal limits  CBG MONITORING, ED - Abnormal; Notable for the following components:   Glucose-Capillary 367 (*)    All other components within normal limits  GROUP A STREP BY PCR  BETA-HYDROXYBUTYRIC ACID  BETA-HYDROXYBUTYRIC ACID  BETA-HYDROXYBUTYRIC ACID  URINALYSIS, ROUTINE W REFLEX MICROSCOPIC  BASIC METABOLIC PANEL WITH GFR  BASIC METABOLIC PANEL WITH GFR  BASIC  METABOLIC PANEL WITH GFR  BASIC METABOLIC PANEL WITH GFR  BETA-HYDROXYBUTYRIC ACID  BASIC METABOLIC PANEL WITH GFR    EKG: EKG Interpretation Date/Time:  Tuesday November 07 2023 20:53:12 EDT Ventricular Rate:  158 PR Interval:  98 QRS Duration:  82 QT Interval:  306 QTC Calculation: 496 R Axis:   -23  Text Interpretation: Sinus tachycardia with short PR Otherwise normal ECG Confirmed by Franklyn Gills [phone]) on 11/07/2023 10:22:20 PM  Radiology: CT Soft Tissue Neck W Contrast Result Date: 11/07/2023 CLINICAL DATA:  Initial evaluation for acute soft tissue infection, sore throat. EXAM: CT NECK WITH CONTRAST TECHNIQUE: Multidetector CT imaging of the neck was performed using the standard protocol following the bolus administration of intravenous contrast. RADIATION DOSE REDUCTION: This exam was performed according to the departmental dose-optimization  program which includes automated exposure control, adjustment of the mA and/or kV according to patient size and/or use of iterative reconstruction technique. CONTRAST:  75mL OMNIPAQUE  IOHEXOL  350 MG/ML SOLN COMPARISON:  None Available. FINDINGS: Pharynx and larynx: Oral cavity within normal limits. No acute abnormality about the dentition. Palatine tonsils symmetric and within normal limits. Nasopharynx normal. Mild thickening and edematous changes about the epiglottis and aryepiglottic folds, suspicious for mild and/or early acute supraglottitis. Associated small layering retropharyngeal effusion, likely reactive. No loculated collection. Small volume layering secretions noted within the hypopharynx. Remainder of the supraglottic larynx and glottis within normal limits. Subglottic airway clear. Salivary glands: Salivary glands including the parotid and submandibular glands are within normal limits. Thyroid : Normal. Lymph nodes: Mildly prominent upper cervical lymph nodes, largest of which measures 1.2 cm a right level 2, presumably reactive. Vascular: Normal intravascular enhancement seen throughout the neck. Limited intracranial: Unremarkable. Visualized orbits: Unremarkable. Mastoids and visualized paranasal sinuses: Visualized paranasal sinuses are clear. Visualized mastoids and middle ear cavities are well pneumatized and free of fluid. Skeleton: No discrete or worrisome osseous lesions. Upper chest: No other acute finding. Other: None. IMPRESSION: 1. Mild thickening and edematous changes about the epiglottis and aryepiglottic folds, consistent with acute supraglottitis. Associated small layering retropharyngeal effusion, likely reactive. No loculated or drainable fluid collections. 2. Mildly prominent upper cervical lymph nodes, presumably reactive. Electronically Signed   By: Morene Hoard M.D.   On: 11/07/2023 23:31    {Document cardiac monitor, telemetry assessment procedure when  appropriate:32947} Procedures   Medications Ordered in the ED  insulin  regular, human (MYXREDLIN ) 100 units/ 100 mL infusion (13 Units/hr Intravenous New Bag/Given 11/07/23 2319)  dextrose  5 % in lactated ringers  infusion (0 mLs Intravenous Hold 11/07/23 2324)  dextrose  50 % solution 0-50 mL (has no administration in time range)  potassium chloride  10 mEq in 100 mL IVPB (10 mEq Intravenous New Bag/Given 11/07/23 2316)  lactated ringers  infusion (has no administration in time range)  ondansetron  (ZOFRAN ) injection 4 mg (has no administration in time range)  dexamethasone  (DECADRON ) injection 8 mg (has no administration in time range)  Ampicillin -Sulbactam (UNASYN ) 3 g in sodium chloride  0.9 % 100 mL IVPB (has no administration in time range)  lactated ringers  bolus 1,996 mL (1,996 mLs Intravenous New Bag/Given 11/07/23 2141)  iohexol  (OMNIPAQUE ) 350 MG/ML injection 75 mL (75 mLs Intravenous Contrast Given 11/07/23 2307)      {Click here for ABCD2, HEART and other calculators REFRESH Note before signing:1}                              Medical Decision Making Amount and/or Complexity of  Data Reviewed Labs: ordered. Radiology: ordered.  Risk Prescription drug management. Decision regarding hospitalization.   This patient presents to the ED for concern of sore throat, this involves a number of treatment options, and is a complaint that carries with it a modeate to high risk of complications and morbidity. A differential diagnosis was considered for the patient's symptoms which is discussed below:   Peritonsillar, retropharyngeal abscess, epiglottitis, supraglottitis, strep throat, mononucleosis   Co morbidities: Discussed in HPI   Brief History:  ***    EMR reviewed including pt PMHx, past surgical history and past visits to ER.   See HPI for more details   Lab Tests:   {Blank single:19197::I ordered and independently interpreted labs. Labs notable for,I personally  reviewed all laboratory work and imaging. Metabolic panel without any acute abnormality specifically kidney function within normal limits and no significant electrolyte abnormalities. CBC without leukocytosis or significant anemia.}   Imaging Studies:  {Blank single:19197::NAD. I personally reviewed all imaging studies and no acute abnormality found. I agree with radiology interpretation.,Abnormal findings. I personally reviewed all imaging studies. Imaging notable for,No imaging studies ordered for this patient}    Cardiac Monitoring:  .{Blank single:19197::The patient was maintained on a cardiac monitor.  I personally viewed and interpreted the cardiac monitored which showed an underlying rhythm of:,NA} .{Blank single:19197::EKG non-ischemic,NA}   Medicines ordered:  I ordered medication including ***  for *** Reevaluation of the patient after these medicines showed that the patient {resolved/improved/worsened:23923::improved} I have reviewed the patients home medicines and have made adjustments as needed   Critical Interventions:  .***   Consults/Attending Physician   .I requested consultation with ENT Dr. Elena Larry,  and discussed lab and imaging findings as well as pertinent plan - they recommend: That I admit to the hospitalist.  I discussed with them that there is some voice muffling and the patient is drooling at times.  She assures me that it is probably nothing   Reevaluation:  After the interventions noted above I re-evaluated patient and found that they have :{resolved/improved/worsened:23923::improved}   Social Determinants of Health:  SABRA{Blank single:19197::Given cab voucher,Social work/case management involved,The patient's social determinants of health were a factor in the care of this patient}    Problem List / ED Course:  ***   Dispostion:  After consideration of the diagnostic results and the patients response to  treatment, I feel that the patent would benefit from ***        Final diagnoses:  None    ED Discharge Orders     None

## 2023-11-07 NOTE — ED Notes (Signed)
 CCMD called.

## 2023-11-08 DIAGNOSIS — E875 Hyperkalemia: Secondary | ICD-10-CM | POA: Diagnosis present

## 2023-11-08 DIAGNOSIS — J069 Acute upper respiratory infection, unspecified: Secondary | ICD-10-CM | POA: Diagnosis not present

## 2023-11-08 DIAGNOSIS — J0430 Supraglottitis, unspecified, without obstruction: Secondary | ICD-10-CM

## 2023-11-08 DIAGNOSIS — E876 Hypokalemia: Secondary | ICD-10-CM | POA: Diagnosis not present

## 2023-11-08 DIAGNOSIS — B370 Candidal stomatitis: Secondary | ICD-10-CM | POA: Diagnosis present

## 2023-11-08 DIAGNOSIS — I10 Essential (primary) hypertension: Secondary | ICD-10-CM | POA: Diagnosis present

## 2023-11-08 DIAGNOSIS — J029 Acute pharyngitis, unspecified: Secondary | ICD-10-CM

## 2023-11-08 DIAGNOSIS — N179 Acute kidney failure, unspecified: Secondary | ICD-10-CM

## 2023-11-08 DIAGNOSIS — R1310 Dysphagia, unspecified: Secondary | ICD-10-CM

## 2023-11-08 DIAGNOSIS — Z833 Family history of diabetes mellitus: Secondary | ICD-10-CM | POA: Diagnosis not present

## 2023-11-08 DIAGNOSIS — Z91199 Patient's noncompliance with other medical treatment and regimen due to unspecified reason: Secondary | ICD-10-CM | POA: Diagnosis not present

## 2023-11-08 DIAGNOSIS — I959 Hypotension, unspecified: Secondary | ICD-10-CM | POA: Diagnosis present

## 2023-11-08 DIAGNOSIS — R000 Tachycardia, unspecified: Secondary | ICD-10-CM | POA: Diagnosis present

## 2023-11-08 DIAGNOSIS — E861 Hypovolemia: Secondary | ICD-10-CM | POA: Diagnosis present

## 2023-11-08 DIAGNOSIS — Z794 Long term (current) use of insulin: Secondary | ICD-10-CM | POA: Diagnosis not present

## 2023-11-08 DIAGNOSIS — E1010 Type 1 diabetes mellitus with ketoacidosis without coma: Secondary | ICD-10-CM | POA: Diagnosis present

## 2023-11-08 DIAGNOSIS — D72829 Elevated white blood cell count, unspecified: Secondary | ICD-10-CM | POA: Diagnosis present

## 2023-11-08 LAB — CBC
HCT: 40.4 % (ref 39.0–52.0)
Hemoglobin: 13.7 g/dL (ref 13.0–17.0)
MCH: 31.1 pg (ref 26.0–34.0)
MCHC: 33.9 g/dL (ref 30.0–36.0)
MCV: 91.8 fL (ref 80.0–100.0)
Platelets: 181 K/uL (ref 150–400)
RBC: 4.4 MIL/uL (ref 4.22–5.81)
RDW: 12 % (ref 11.5–15.5)
WBC: 18.6 K/uL — ABNORMAL HIGH (ref 4.0–10.5)
nRBC: 0 % (ref 0.0–0.2)

## 2023-11-08 LAB — BASIC METABOLIC PANEL WITH GFR
Anion gap: 22 — ABNORMAL HIGH (ref 5–15)
Anion gap: 16 — ABNORMAL HIGH (ref 5–15)
Anion gap: 14 (ref 5–15)
Anion gap: 15 (ref 5–15)
BUN: 13 mg/dL (ref 6–20)
BUN: 10 mg/dL (ref 6–20)
BUN: 8 mg/dL (ref 6–20)
BUN: 8 mg/dL (ref 6–20)
CO2: 10 mmol/L — ABNORMAL LOW (ref 22–32)
CO2: 14 mmol/L — ABNORMAL LOW (ref 22–32)
CO2: 16 mmol/L — ABNORMAL LOW (ref 22–32)
CO2: 18 mmol/L — ABNORMAL LOW (ref 22–32)
Calcium: 10 mg/dL (ref 8.9–10.3)
Calcium: 9.5 mg/dL (ref 8.9–10.3)
Calcium: 9.5 mg/dL (ref 8.9–10.3)
Calcium: 9.4 mg/dL (ref 8.9–10.3)
Chloride: 105 mmol/L (ref 98–111)
Chloride: 107 mmol/L (ref 98–111)
Chloride: 107 mmol/L (ref 98–111)
Chloride: 106 mmol/L (ref 98–111)
Creatinine, Ser: 1.71 mg/dL — ABNORMAL HIGH (ref 0.61–1.24)
Creatinine, Ser: 1.1 mg/dL (ref 0.61–1.24)
Creatinine, Ser: 0.84 mg/dL (ref 0.61–1.24)
Creatinine, Ser: 0.75 mg/dL (ref 0.61–1.24)
GFR, Estimated: 56 mL/min — ABNORMAL LOW (ref >60)
GFR, Estimated: >60 mL/min (ref >60)
GFR, Estimated: >60 mL/min (ref >60)
GFR, Estimated: >60 mL/min (ref >60)
Glucose, Bld: 296 mg/dL — ABNORMAL HIGH (ref 70–99)
Glucose, Bld: 197 mg/dL — ABNORMAL HIGH (ref 70–99)
Glucose, Bld: 194 mg/dL — ABNORMAL HIGH (ref 70–99)
Glucose, Bld: 155 mg/dL — ABNORMAL HIGH (ref 70–99)
Potassium: 4.6 mmol/L (ref 3.5–5.1)
Potassium: 3.9 mmol/L (ref 3.5–5.1)
Potassium: 3.6 mmol/L (ref 3.5–5.1)
Potassium: 3.5 mmol/L (ref 3.5–5.1)
Sodium: 137 mmol/L (ref 135–145)
Sodium: 137 mmol/L (ref 135–145)
Sodium: 137 mmol/L (ref 135–145)
Sodium: 139 mmol/L (ref 135–145)

## 2023-11-08 LAB — URINALYSIS, ROUTINE W REFLEX MICROSCOPIC
Bacteria, UA: NONE SEEN
Bilirubin Urine: NEGATIVE
Glucose, UA: >=500 mg/dL — AB
Ketones, ur: 80 mg/dL — AB
Leukocytes,Ua: NEGATIVE
Nitrite: NEGATIVE
Protein, ur: 100 mg/dL — AB
Specific Gravity, Urine: 1.031 — ABNORMAL HIGH (ref 1.005–1.030)
pH: 5 (ref 5.0–8.0)

## 2023-11-08 LAB — PHOSPHORUS: Phosphorus: <1 mg/dL — CL (ref 2.5–4.6)

## 2023-11-08 LAB — MAGNESIUM: Magnesium: 2.1 mg/dL (ref 1.7–2.4)

## 2023-11-08 LAB — CBG MONITORING, ED
Glucose-Capillary: 163 mg/dL — ABNORMAL HIGH (ref 70–99)
Glucose-Capillary: 171 mg/dL — ABNORMAL HIGH (ref 70–99)
Glucose-Capillary: 175 mg/dL — ABNORMAL HIGH (ref 70–99)
Glucose-Capillary: 177 mg/dL — ABNORMAL HIGH (ref 70–99)
Glucose-Capillary: 181 mg/dL — ABNORMAL HIGH (ref 70–99)
Glucose-Capillary: 188 mg/dL — ABNORMAL HIGH (ref 70–99)
Glucose-Capillary: 194 mg/dL — ABNORMAL HIGH (ref 70–99)
Glucose-Capillary: 231 mg/dL — ABNORMAL HIGH (ref 70–99)
Glucose-Capillary: 302 mg/dL — ABNORMAL HIGH (ref 70–99)
Glucose-Capillary: 318 mg/dL — ABNORMAL HIGH (ref 70–99)

## 2023-11-08 LAB — BETA-HYDROXYBUTYRIC ACID
Beta-Hydroxybutyric Acid: 5.42 mmol/L — ABNORMAL HIGH (ref 0.05–0.27)
Beta-Hydroxybutyric Acid: 3.35 mmol/L — ABNORMAL HIGH (ref 0.05–0.27)
Beta-Hydroxybutyric Acid: 2.62 mmol/L — ABNORMAL HIGH (ref 0.05–0.27)
Beta-Hydroxybutyric Acid: 1.61 mmol/L — ABNORMAL HIGH (ref 0.05–0.27)

## 2023-11-08 LAB — GLUCOSE, CAPILLARY
Glucose-Capillary: 197 mg/dL — ABNORMAL HIGH (ref 70–99)
Glucose-Capillary: 177 mg/dL — ABNORMAL HIGH (ref 70–99)
Glucose-Capillary: 187 mg/dL — ABNORMAL HIGH (ref 70–99)
Glucose-Capillary: 174 mg/dL — ABNORMAL HIGH (ref 70–99)
Glucose-Capillary: 161 mg/dL — ABNORMAL HIGH (ref 70–99)
Glucose-Capillary: 199 mg/dL — ABNORMAL HIGH (ref 70–99)
Glucose-Capillary: 117 mg/dL — ABNORMAL HIGH (ref 70–99)

## 2023-11-08 LAB — MONONUCLEOSIS SCREEN: Mono Screen: NEGATIVE

## 2023-11-08 LAB — MRSA NEXT GEN BY PCR, NASAL: MRSA by PCR Next Gen: NOT DETECTED

## 2023-11-08 MED ORDER — INSULIN ASPART 100 UNIT/ML IJ SOLN: 0.0000 [IU] | INTRAMUSCULAR | Status: DC

## 2023-11-08 MED ORDER — ONDANSETRON HCL 4 MG/2ML IJ SOLN
4.0000 mg | Freq: Once | INTRAMUSCULAR | Status: DC
  Filled 2023-11-08: qty 2

## 2023-11-08 MED ORDER — LACTATED RINGERS IV BOLUS
1000.0000 mL | Freq: Once | INTRAVENOUS | Status: AC
  Administered 2023-11-08: 1000 mL via INTRAVENOUS

## 2023-11-08 MED ORDER — POTASSIUM CHLORIDE CRYS ER 20 MEQ PO TBCR
40.0000 meq | EXTENDED_RELEASE_TABLET | Freq: Once | ORAL | Status: AC
  Administered 2023-11-08: 40 meq via ORAL
  Filled 2023-11-08: qty 2

## 2023-11-08 MED ORDER — INSULIN ASPART 100 UNIT/ML IJ SOLN: 0.0000 [IU] | Freq: Every day | INTRAMUSCULAR | Status: DC

## 2023-11-08 MED ORDER — INSULIN GLARGINE 100 UNIT/ML ~~LOC~~ SOLN
30.0000 [IU] | Freq: Every day | SUBCUTANEOUS | Status: DC
  Administered 2023-11-08 – 2023-11-10 (×3): 30 [IU] via SUBCUTANEOUS
  Filled 2023-11-08 (×3): qty 0.3

## 2023-11-08 MED ORDER — AMOXICILLIN-POT CLAVULANATE 875-125 MG PO TABS
1.0000 | ORAL_TABLET | Freq: Two times a day (BID) | ORAL | Status: DC
  Administered 2023-11-08 – 2023-11-10 (×5): 1 via ORAL
  Filled 2023-11-08 (×6): qty 1

## 2023-11-08 MED ORDER — POLYETHYLENE GLYCOL 3350 17 G PO PACK: 17.0000 g | PACK | Freq: Every day | ORAL | Status: DC | PRN

## 2023-11-08 MED ORDER — HEPARIN SODIUM (PORCINE) 5000 UNIT/ML IJ SOLN
5000.0000 [IU] | Freq: Three times a day (TID) | INTRAMUSCULAR | Status: DC
  Administered 2023-11-08 – 2023-11-10 (×6): 5000 [IU] via SUBCUTANEOUS
  Filled 2023-11-08 (×6): qty 1

## 2023-11-08 MED ORDER — INSULIN ASPART 100 UNIT/ML IJ SOLN
6.0000 [IU] | Freq: Three times a day (TID) | INTRAMUSCULAR | Status: DC
  Administered 2023-11-08 – 2023-11-10 (×2): 6 [IU] via SUBCUTANEOUS

## 2023-11-08 MED ORDER — CHLORHEXIDINE GLUCONATE CLOTH 2 % EX PADS
6.0000 | MEDICATED_PAD | Freq: Every day | CUTANEOUS | Status: DC
  Administered 2023-11-08: 6 via TOPICAL

## 2023-11-08 MED ORDER — DOCUSATE SODIUM 100 MG PO CAPS: 100.0000 mg | ORAL_CAPSULE | Freq: Two times a day (BID) | ORAL | Status: DC | PRN

## 2023-11-08 MED ORDER — INSULIN GLARGINE-YFGN 100 UNIT/ML ~~LOC~~ SOLN: 20.0000 [IU] | Freq: Every day | SUBCUTANEOUS | Status: DC

## 2023-11-08 MED ORDER — NYSTATIN 100000 UNIT/ML MT SUSP
5.0000 mL | Freq: Three times a day (TID) | OROMUCOSAL | Status: DC
  Administered 2023-11-08 – 2023-11-10 (×9): 500000 [IU] via ORAL
  Filled 2023-11-08 (×12): qty 5

## 2023-11-08 MED ORDER — CALCIUM GLUCONATE-NACL 1-0.675 GM/50ML-% IV SOLN
1.0000 g | Freq: Once | INTRAVENOUS | Status: AC
  Administered 2023-11-08: 1000 mg via INTRAVENOUS
  Filled 2023-11-08: qty 50

## 2023-11-08 MED ORDER — SODIUM PHOSPHATES 45 MMOLE/15ML IV SOLN
45.0000 mmol | Freq: Once | INTRAVENOUS | Status: AC
Start: 2023-11-08 — End: 2023-11-08
  Administered 2023-11-08: 45 mmol via INTRAVENOUS
  Filled 2023-11-08: qty 15

## 2023-11-08 MED ORDER — ORAL CARE MOUTH RINSE: 15.0000 mL | OROMUCOSAL | Status: DC | PRN

## 2023-11-08 MED ORDER — INSULIN ASPART 100 UNIT/ML IJ SOLN
0.0000 [IU] | Freq: Three times a day (TID) | INTRAMUSCULAR | Status: DC
  Administered 2023-11-08 – 2023-11-09 (×2): 3 [IU] via SUBCUTANEOUS
  Administered 2023-11-09: 2 [IU] via SUBCUTANEOUS
  Administered 2023-11-09 – 2023-11-10 (×2): 3 [IU] via SUBCUTANEOUS

## 2023-11-08 NOTE — H&P (Signed)
 NAME:  Terry Jensen, MRN:  [id number], DOB:  1995/05/01, LOS: 0 ADMISSION DATE:  11/07/2023, CONSULTATION DATE:  11/08/23 REFERRING MD:  Mesner CHIEF COMPLAINT:  Sore throat   History of Present Illness:  Terry Jensen is a 28 y.o. male who has a PMH as below including DM1, prior DKA, HTN.  He presented to [facility] ED on 11/07/23 with sore throat, polyuria, polydipsia.  He does not reliably give himself insulin  and had not given himself insulin  earlier that day or all week.  Has not been eating/drinking 2/2 sore throat thst started day of presentation and has had occasional drooling.  No known fevers.  In the ED, he was found to have hyperglycemia and metabolic acidosis with labs concerning for DKA.  CT soft tissue of the neck was obtained and demonstrated supraglottitis with no obvious abscess or drainable collection.  Case was discussed with ENT who recommended Unasyn  and Decadron .  EDP was concerned about worsening airway compromise; therefore, PCCM was called for admission for ICU monitoring at least overnight.  Started on fluids and insulin  per DKA protocol. Mom at bedside.  Pertinent  Medical History:  has Sports physical; DKA (diabetic ketoacidosis) (HCC); Diabetes mellitus with ketoacidosis, uncontrolled (HCC); AKI (acute kidney injury); Hyperbilirubinemia; Leucocytosis; and Class 1 obesity on their problem list.  Significant Hospital Events: Including procedures, antibiotic start and stop dates in addition to other pertinent events   9/24 admit  Interim History / Subjective:  Currently no complaints except tired. Throat pain controlled.  Objective:  Blood pressure (!) 131/93, pulse (!) 121, temperature 98.9 F (37.2 C), temperature source Oral, resp. rate 15, height 6' 2 (1.88 m), weight 99.8 kg, SpO2 100%.       No intake or output data in the 24 hours ending 11/08/23 0105 Filed Weights   11/07/23 2051  Weight: 99.8 kg     Physical Exam: General: Young adult AA male,  resting in bed, in NAD. Neuro: Sleepy but A&O x 3 once aroused. HEENT: [place]/AT. Sclerae anicteric. MM dry. Presumed oral thrush? Posterior oropharynx erythematous. Cardiovascular: Tachy, regular, no M/R/G.  Lungs: Respirations even and unlabored.  CTA bilaterally, No W/R/R. Abdomen: BS x 4, soft, NT/ND.  Musculoskeletal: No gross deformities, no edema.    Labs/imaging personally reviewed:  CT soft tissue neck > supraglottitis.  Assessment & Plan:   DKA - likely 2/2 noncompliance fully with insulin  regimen + suprgralottitis.  - Continue fluids and insulin  per DKA protocol. - Expect CBG's to climb with Decadron  (see below). - DM educator prior to d/c.  Suprgalottitis - ENT consulted by EDP, no acute interventions required. - Continue empiric Unasyn . - Continue Decadron  per ENT recs.  AKI - 2/2 hypovolemia/above. Hyperkalemia - 2/2 hypovolemia/above. AGMA - 2/2 above. - Continue fluids/insulin  per DKA protocol, additional 1L bolus now. - 1g Ca gluconate empirically. - Frequent BMP's per protocol.  ? Oral thrush - presumed 2/2 uncontrolled DM. - Oral Nystatin .   Labs   CBC: Recent Labs  Lab 11/07/23 2059 11/07/23 2232 11/07/23 2233  WBC 19.7*  --   --   NEUTROABS 16.8*  --   --   HGB 17.3* 17.0 17.3*  HCT 52.9* 50.0 51.0  MCV 95.7  --   --   PLT 225  --   --     Basic Metabolic Panel: Recent Labs  Lab 11/07/23 2059 11/07/23 2232 11/07/23 2233  NA 131* 134* 135  K 5.0 6.0* 6.0*  CL 94*  --  110  CO2 8*  --   --   GLUCOSE 502*  --  479*  BUN 14  --  17  CREATININE 2.15*  --  1.20  CALCIUM  10.2  --   --    GFR: Estimated Creatinine Clearance: 117.7 mL/min (by C-G formula based on SCr of 1.2 mg/dL). Recent Labs  Lab 11/07/23 2059  WBC 19.7*    Liver Function Tests: Recent Labs  Lab 11/07/23 2059  AST 17  ALT 16  ALKPHOS 107  BILITOT 2.2*  PROT 9.5*  ALBUMIN 4.4   No results for input(s): LIPASE, AMYLASE in the last 168 hours. No results  for input(s): AMMONIA in the last 168 hours.  ABG    Component Value Date/Time   HCO3 7.9 (L) 11/07/2023 2232   TCO2 10 (L) 11/07/2023 2233   ACIDBASEDEF 20.0 (H) 11/07/2023 2232   O2SAT 98 11/07/2023 2232     Coagulation Profile: No results for input(s): INR, PROTIME in the last 168 hours.  Cardiac Enzymes: No results for input(s): CKTOTAL, CKMB, CKMBINDEX, TROPONINI in the last 168 hours.  HbA1C: Hgb A1c MFr Bld  Date/Time Value Ref Range Status  03/15/2023 10:55 AM 12.4 (H) 4.8 - 5.6 % Final    Comment:    (NOTE) Pre diabetes:          5.7%-6.4%  Diabetes:              >6.4%  Glycemic control for   <7.0% adults with diabetes   10/24/2019 08:29 PM 12.2 (H) 4.8 - 5.6 % Final    Comment:    (NOTE) Pre diabetes:          5.7%-6.4%  Diabetes:              >6.4%  Glycemic control for   <7.0% adults with diabetes     CBG: Recent Labs  Lab 11/07/23 2056 11/07/23 2313 11/08/23 0008 11/08/23 0010 11/08/23 0103  GLUCAP 498* 367* 318* 302* 231*    Review of Systems:   All negative; except for those that are bolded, which indicate positives.  Constitutional: weight loss, weight gain, night sweats, fevers, chills, fatigue, weakness.  HEENT: headaches, sore throat, sneezing, nasal congestion, post nasal drip, difficulty swallowing, tooth/dental problems, visual complaints, visual changes, ear aches. Neuro: difficulty with speech, weakness, numbness, ataxia. CV:  chest pain, orthopnea, PND, swelling in lower extremities, dizziness, palpitations, syncope.  Resp: cough, hemoptysis, dyspnea, wheezing. GI: heartburn, indigestion, abdominal pain, nausea, vomiting, diarrhea, constipation, change in bowel habits, loss of appetite, hematemesis, melena, hematochezia.  GU: dysuria, change in color of urine, urgency or frequency, flank pain, hematuria. MSK: joint pain or swelling, decreased range of motion. Psych: change in mood or affect, depression, anxiety,  suicidal ideations, homicidal ideations. Skin: rash, itching, bruising.   Past Medical History:  He,  has a past medical history of Diabetes mellitus without complication (HCC) and Hypertension.   Surgical History:  History reviewed. No pertinent surgical history.   Social History:   reports that he has never smoked. He has never used smokeless tobacco. He reports current alcohol use. He reports that he does not use drugs.   Family History:  His family history includes Cancer in an other family member; Diabetes in an other family member. There is no history of Sudden death or Heart attack.   Allergies No Known Allergies   Home Medications  Prior to Admission medications   Medication Sig Start Date End Date Taking? Authorizing Provider  insulin  glargine (LANTUS )  100 UNIT/ML Solostar Pen Inject 30 Units into the skin at bedtime. 03/17/23   Alexander-Savino, [place], MD  insulin  lispro (HUMALOG ) 100 UNIT/ML KwikPen Inject 8 units three times a day before each meal. See sliding scale in patient instructions for additional units after each meal 03/17/23   Alexander-Savino, [place], MD  Insulin  Pen Needle 32G X 4 MM MISC Use 4x a day 03/17/23   Alexander-Savino, [place], MD     Critical care time: 30 min.   Sammi Gore, PA - C [facility] For pager details, please see AMION or use Epic chat  After 1900, please call [facility] for cross coverage needs 11/08/2023, 1:05 AM

## 2023-11-08 NOTE — ED Provider Notes (Signed)
Physical Exam  BP 129/84   Pulse (!) 141   Temp 98.9 F (37.2 C) (Oral)   Resp 19   Ht 6' 2 (1.88 m)   Wt 99.8 kg   SpO2 98%   BMI 28.25 kg/m   Physical Exam Vitals and nursing note reviewed.  Constitutional:      General: He is not in acute distress.    Appearance: He is well-developed.  HENT:     Head: Normocephalic and atraumatic.     Mouth/Throat:     Mouth: Mucous membranes are dry.     Comments: Trismus, only able to open mouth two finger widths apart.  Swallowing secretions.  Talks with muffled voice.  Posterior oropharynx with spotty areas of erythema.  No exudate noted. No abscess noted Eyes:     Conjunctiva/sclera: Conjunctivae normal.  Cardiovascular:     Rate and Rhythm: Regular rhythm. Tachycardia present.     Heart sounds: No murmur heard. Pulmonary:     Effort: Pulmonary effort is normal. No respiratory distress.     Breath sounds: Normal breath sounds.  Abdominal:     Palpations: Abdomen is soft.     Tenderness: There is no abdominal tenderness.  Musculoskeletal:        General: No swelling.     Cervical back: Neck supple.  Skin:    General: Skin is warm and dry.     Capillary Refill: Capillary refill takes less than 2 seconds.  Neurological:     Mental Status: He is alert.  Psychiatric:        Mood and Affect: Mood normal.     Procedures  Procedures  ED Course / MDM   Clinical Course as of 11/08/23 0152  Wed Nov 08, 2023  0007 Dr. Harlene Hefty [WF]  [phone] Reexamined patient, he remains well-appearing.  Still swallowing secretions and able to talk, but with muffled voice.  Currently protecting airway.  He has been admitted with Dr. Layman with intensivist service [AF]    Clinical Course User Index [AF] Veta Palma, PA-C [WF] Neldon Hamp RAMAN, [place]   Medical Decision Making Amount and/or Complexity of Data Reviewed Labs: ordered. Radiology: ordered.  Risk Prescription drug management. Decision regarding  hospitalization.    Patient accepted at shift change.  Please see prior providers note for full details.  In short, patient presents to the ER with sore throat that started yesterday as well as increased thirst and urination.  On initial workup, was found to have DKA with glucose that 502, anion gap at 22, and acidosis on VBG at 7.093.  Elevated beta hydroxybutyric acid at 7.62.  Insulin  drip was initiated by previous team as well as fluids and potassium.  He was also reporting sore throat.  Has trismus on exam as well as erythematous posterior pharynx.  Muffled voice present.  Able to swallow secretions and is currently protecting airway.  On CT scan, was found to have acute supraglottitis.  He was started on Unasyn , LR bolus, and Decadron  for treatment. Sepsis considered and covered for with fluids and antibiotics given his leukocytosis at 19.7 and tachycardia.  This likely is also due to dehydration and has DKA.  ENT Dr. Elena Larry was consulted by previous team, and recommended admission, Unasyn , and Decadron .  ICU consulted on patient, and recommends admission.  Patient admitted with Dr. Harlene Layman. Patient remains stable and protecting airway.       Veta Palma, PA-C 11/08/23 9847    Lorette Mayo, MD 11/08/23  0200  

## 2023-11-08 NOTE — Inpatient Diabetes Management (Addendum)
 Inpatient Diabetes Program Recommendations  AACE/ADA: New Consensus Statement on Inpatient Glycemic Control (2015)  Target Ranges:  Prepandial:   less than 140 mg/dL      Peak postprandial:   less than 180 mg/dL (1-2 hours)      Critically ill patients:  140 - 180 mg/dL   Lab Results  Component Value Date   GLUCAP 171 (H) 11/08/2023   HGBA1C 12.4 (H) 03/15/2023    Review of Glycemic Control  Latest Reference Range & Units 11/08/23 06:50 11/08/23 08:02 11/08/23 09:08  Glucose-Capillary 70 - 99 mg/dL 836 (H) 818 (H) 828 (H)   Diabetes history: DM 1 Outpatient Diabetes medications:  Lantus  30 units q HS, Humalog  8 units tid with meals  Current orders for Inpatient glycemic control:  IV insulin  Inpatient Diabetes Program Recommendations:    Note patient on IV insulin .   CO2=14.  Continue IV insulin  until acidosis cleared.   -Once acidosis is cleared, consider transition to Lantus  20 units daily and Novolog  0-9 units q 4 hours.  Will also need SQ meal coverage once patient is eating due to history of Type 1 DM.   Addendum 1415- Spoke to patient at bedside regarding DM management.  He states he takes insulin  when he eats.  Attempted to discuss insulin  regimen with him.  However patient not engaged in conversation.  Attempted to talk to patient regarding CGM as well.  Patient states that he does not want a sensor and prefers to stick his fingers.  Left patient with a pamphlet on CGM and will attempt to re-engage tomorrow.   Thanks,  Randall Bullocks, RN, BC-ADM Inpatient Diabetes Coordinator Pager [phone]  (8a-5p)

## 2023-11-08 NOTE — Progress Notes (Signed)
 NAME:  Terry Jensen, MRN:  [id number], DOB:  Mar 09, 1995, LOS: 0 ADMISSION DATE:  11/07/2023, CONSULTATION DATE:  11/08/23 REFERRING MD:  Mesner CHIEF COMPLAINT:  Sore throat   History of Present Illness:  Terry Jensen is a 28 y.o. male who has a PMH as below including DM1, prior DKA, HTN.  He presented to [facility] ED on 11/07/23 with sore throat, polyuria, polydipsia.  He does not reliably give himself insulin  and had not given himself insulin  earlier that day or all week.  Has not been eating/drinking 2/2 sore throat thst started day of presentation and has had occasional drooling.  No known fevers.  In the ED, he was found to have hyperglycemia and metabolic acidosis with labs concerning for DKA.  CT soft tissue of the neck was obtained and demonstrated supraglottitis with no obvious abscess or drainable collection.  Case was discussed with ENT who recommended Unasyn  and Decadron .  EDP was concerned about worsening airway compromise; therefore, PCCM was called for admission for ICU monitoring at least overnight.  Started on fluids and insulin  per DKA protocol. Mom at bedside.  Pertinent  Medical History:  has Sports physical; DKA (diabetic ketoacidosis) (HCC); Diabetes mellitus with ketoacidosis, uncontrolled (HCC); AKI (acute kidney injury); Hyperbilirubinemia; Leucocytosis; Class 1 obesity; Pharyngitis; and Odynophagia on their problem list.  Significant Hospital Events: Including procedures, antibiotic start and stop dates in addition to other pertinent events   9/24 admit  Interim History / Subjective:  Denies any specific complaints at present His throat feels better Denies having any significant difficulty with his insulin  Last had DKA about a year ago  Objective:  Blood pressure (!) 92/55, pulse (!) 108, temperature 98.6 F (37 C), temperature source Oral, resp. rate 20, height 6' 2 (1.88 m), weight 99.8 kg, SpO2 99%.        Intake/Output Summary (Last 24 hours) at 11/08/2023  0958 Last data filed at 11/08/2023 0820 Gross per 24 hour  Intake 56.82 ml  Output 1000 ml  Net -943.18 ml   Filed Weights   11/07/23 2051  Weight: 99.8 kg   Physical Exam: General: Young gentleman, does not appear to be in distress Neuro: Awake alert interactive HEENT: Moist oral mucosa  cardiovascular: S1-S2 appreciated Lungs: Clear breath sounds bilaterally Abdomen: Bowel sounds appreciated Musculoskeletal: No gross deformities, no edema.   I reviewed last 24 h vitals and pain scores, last 48 h intake and output, last 24 h labs and trends, and last 24 h imaging results. Anion gap 16-improving Phosphorus of less than 1-being replaced Bicarb of 14-improving  Labs/imaging personally reviewed:  CT soft tissue neck > supraglottitis.  Assessment & Plan:   DKA - Likely related to noncompliance, supraglottitis - Will remain on antibiotics, currently on Unasyn  - Will plan to transition to Augmentin  -Discontinue steroids - Will need diabetic education  Supraglottitis - Appreciate ENT follow-up - Will continue antibiotics  AKI likely due to hypovolemia - Continue fluid resuscitation  Hyperkalemia AGMA-secondary to above - Continue DKA control - Monitor electrolytes  Oral nystatin  for thrush  If patient remains hemodynamically stable later today May be able to transition out of the ICU   Labs   CBC: Recent Labs  Lab 11/07/23 2059 11/07/23 2232 11/07/23 2233 11/08/23 0432  WBC 19.7*  --   --  18.6*  NEUTROABS 16.8*  --   --   --   HGB 17.3* 17.0 17.3* 13.7  HCT 52.9* 50.0 51.0 40.4  MCV 95.7  --   --  91.8  PLT 225  --   --  181    Basic Metabolic Panel: Recent Labs  Lab 11/07/23 2059 11/07/23 2232 11/07/23 2233 11/08/23 0017 11/08/23 0432  NA 131* 134* 135 137 137  K 5.0 6.0* 6.0* 4.6 3.9  CL 94*  --  110 105 107  CO2 8*  --   --  10* 14*  GLUCOSE 502*  --  479* 296* 197*  BUN 14  --  17 13 10   CREATININE 2.15*  --  1.20 1.71* 1.10  CALCIUM   10.2  --   --  10.0 9.5  MG  --   --   --   --  2.1  PHOS  --   --   --   --  <1.0*   GFR: Estimated Creatinine Clearance: 128.4 mL/min (by C-G formula based on SCr of 1.1 mg/dL). Recent Labs  Lab 11/07/23 2059 11/08/23 0432  WBC 19.7* 18.6*    Liver Function Tests: Recent Labs  Lab 11/07/23 2059  AST 17  ALT 16  ALKPHOS 107  BILITOT 2.2*  PROT 9.5*  ALBUMIN 4.4   No results for input(s): LIPASE, AMYLASE in the last 168 hours. No results for input(s): AMMONIA in the last 168 hours.  ABG    Component Value Date/Time   HCO3 7.9 (L) 11/07/2023 2232   TCO2 10 (L) 11/07/2023 2233   ACIDBASEDEF 20.0 (H) 11/07/2023 2232   O2SAT 98 11/07/2023 2232     Coagulation Profile: No results for input(s): INR, PROTIME in the last 168 hours.  Cardiac Enzymes: No results for input(s): CKTOTAL, CKMB, CKMBINDEX, TROPONINI in the last 168 hours.  HbA1C: Hgb A1c MFr Bld  Date/Time Value Ref Range Status  03/15/2023 10:55 AM 12.4 (H) 4.8 - 5.6 % Final    Comment:    (NOTE) Pre diabetes:          5.7%-6.4%  Diabetes:              >6.4%  Glycemic control for   <7.0% adults with diabetes   10/24/2019 08:29 PM 12.2 (H) 4.8 - 5.6 % Final    Comment:    (NOTE) Pre diabetes:          5.7%-6.4%  Diabetes:              >6.4%  Glycemic control for   <7.0% adults with diabetes     CBG: Recent Labs  Lab 11/08/23 0430 11/08/23 0537 11/08/23 0650 11/08/23 0802 11/08/23 0908  GLUCAP 188* 177* 163* 181* 171*    Review of Systems:   Review of systems only significant for fatigue, sore throat  Past Medical History:  He,  has a past medical history of Diabetes mellitus without complication (HCC) and Hypertension.   Surgical History:  History reviewed. No pertinent surgical history.   Social History:   reports that he has never smoked. He has never used smokeless tobacco. He reports current alcohol use. He reports that he does not use drugs.   Family  History:  His family history includes Cancer in an other family member; Diabetes in an other family member. There is no history of Sudden death or Heart attack.   Allergies No Known Allergies   The patient is critically ill with multiple organ systems failure and requires high complexity decision making for assessment and support, frequent evaluation and titration of therapies, application of advanced monitoring technologies and extensive interpretation of multiple databases. Critical Care Time devoted to patient care  services described in this note independent of APP/resident time (if applicable)  is 30 minutes.   Jennet Epley MD [facility] Personal pager: See Amion If unanswered, please page CCM On-call: #[phone]

## 2023-11-08 NOTE — TOC CM/SW Note (Signed)
 Transition of Care [facility]) - Inpatient Brief Assessment   Patient Details  Name: Terry Jensen MRN: [id number] Date of Birth: 02/26/1995  Transition of Care [facility]) CM/SW Contact:    Lauraine FORBES Saa, LCSWA Phone Number: 11/08/2023, 1:39 PM   Clinical Narrative:  1:39 PM Per chart review, patient resides at home alone. Patient has insurance but does not have a PCP. Patient does not have SNF/HH/DME history. Patient's preferred pharmacy's are [facility] and [facility] [phone] [place]. TOC will continue to follow and be available to assist.  Transition of Care Asessment: Insurance and Status: Insurance coverage has been reviewed Patient has primary care physician: No Home environment has been reviewed: Private Residence Prior level of function:: N/A Prior/Current Home Services: No current home services Social Drivers of Health Review: SDOH reviewed no interventions necessary Readmission risk has been reviewed: Yes (Currently Green 9%) Transition of care needs: transition of care needs identified, TOC will continue to follow

## 2023-11-08 NOTE — ED Provider Notes (Signed)
 Update:  Patient looks tremendously improved from last night.  I messaged Dr. Layman of PCCM to update. May be reasonable for floor now.    Neldon Inoue [place], [place] 11/08/23 9140    Levander Houston, MD 11/13/23 1245

## 2023-11-08 NOTE — Consult Note (Signed)
 ENT CONSULT:  Reason for Consult: concern for supraglottitis on CT neck    HPI: Discussed the use of AI scribe software for clinical note transcription with the patient, who gave verbal consent to proceed.  28 yoM hx of T1DM and prior DKA who came in with sore throat polyuria and polydipsia for 2 days, being admitted to Medicine for DKA. CT neck was obtained due to sore throat and occasional drooling pain with eating with a report of supraglottitis and ENT was consulted for evaluation.   At bedside, patient is lying in bed comfortably without dyspnea stridor or drooling. Received Dexamethasone  and Unasyn  in ED.   Records Reviewed:  ED provider note 11/07/23 Patient is a 28 year old male with a past medical history significant for diabetes type 1, history of DKA, hypertension   Patient presents emergency room today with complaints of sore throat and polyuria polydipsia   He informs me that he does not reliably give himself insulin  he states that he does this sometimes he has not given him insulin  himself insulin  today.SABRA  He denies any chest pain difficulty breathing he does endorse muffled voice.    Past Medical History:  Diagnosis Date   Diabetes mellitus without complication (HCC)    Hypertension     History reviewed. No pertinent surgical history.  Family History  Problem Relation Age of Onset   Cancer Other    Diabetes Other    Sudden death Neg Hx    Heart attack Neg Hx     Social History:  reports that he has never smoked. He has never used smokeless tobacco. He reports current alcohol use. He reports that he does not use drugs.  Allergies: No Known Allergies  Medications: I have reviewed the patient's current medications.  The PMH, PSH, Medications, Allergies, and SH were reviewed and updated.  ROS: Constitutional: Negative for fever, weight loss and weight gain. Cardiovascular: Negative for chest pain and dyspnea on exertion. Respiratory: Is not experiencing  shortness of breath at rest. Gastrointestinal: Negative for nausea and vomiting. Neurological: Negative for headaches. Psychiatric: The patient is not nervous/anxious  Blood pressure 113/77, pulse (!) 107, temperature 98.4 F (36.9 C), resp. rate 15, height 6' 2 (1.88 m), weight 99.8 kg, SpO2 98%. Body mass index is 28.25 kg/m.  PHYSICAL EXAM:  Exam: General: Well-developed, well-nourished Respiratory Respiratory effort: Equal inspiration and expiration without stridor Cardiovascular Peripheral Vascular: Warm extremities with equal color/perfusion Eyes: No nystagmus with equal extraocular motion bilaterally Neuro/Psych/Balance: Patient oriented to person, place, and time; Appropriate mood and affect; Gait is intact with no imbalance; Cranial nerves I-XII are intact Head and Face Inspection: Normocephalic and atraumatic without mass or lesion Palpation: Facial skeleton intact without bony stepoffs Salivary Glands: No mass or tenderness  Facial Strength: Facial motility symmetric and full bilaterally ENT Pinna: External ear intact and fully developed External canal: Canal is patent with intact skin Tympanic Membrane: Clear and mobile External Nose: No scar or anatomic deformity Internal Nose: Septum is relatively straight on anterior rhinoscopy. Bilateral inferior turbinate hypertrophy.  Lips, Teeth, and gums: Mucosa and teeth intact and viable Oral cavity/oropharynx: Mild erythema no exudate, no lesions present no trismus Neck Neck and Trachea: Midline trachea without mass or lesion normal neck range of motion Thyroid : No mass or nodularity Lymphatics: No lymphadenopathy  Studies Reviewed: CT neck soft tissue 11/07/23 - personally reviewed no evidence of thickened epiglottis or narrowing of supraglottic glottic or subglottic airway   Radiology report 1. Mild thickening and edematous changes  about the epiglottis and aryepiglottic folds, consistent with acute  supraglottitis. Associated small layering retropharyngeal effusion, likely reactive. No loculated or drainable fluid collections. 2. Mildly prominent upper cervical lymph nodes, presumably reactive.  Assessment/Plan: Upper respiratory infection  DKA   Imaging reviewed and there is no evidence of epiglottitis or upper airway edema or narrowing.   - ok to discontinue steroids - continue Unasyn  and when ready transition to PO antibiotics - medical management per primary team   Thank you for allowing me to participate in the care of this patient. Please do not hesitate to contact me with any questions or concerns.   Elena Larry, MD Otolaryngology [facility] Phone: [phone] Fax: [phone]    11/08/2023, 7:57 AM

## 2023-11-09 DIAGNOSIS — E1010 Type 1 diabetes mellitus with ketoacidosis without coma: Secondary | ICD-10-CM | POA: Diagnosis not present

## 2023-11-09 LAB — BASIC METABOLIC PANEL WITH GFR
Anion gap: 10 (ref 5–15)
BUN: 6 mg/dL (ref 6–20)
CO2: 19 mmol/L — ABNORMAL LOW (ref 22–32)
Calcium: 8.9 mg/dL (ref 8.9–10.3)
Chloride: 106 mmol/L (ref 98–111)
Creatinine, Ser: 0.81 mg/dL (ref 0.61–1.24)
GFR, Estimated: >60 mL/min (ref >60)
Glucose, Bld: 138 mg/dL — ABNORMAL HIGH (ref 70–99)
Potassium: 2.7 mmol/L — CL (ref 3.5–5.1)
Sodium: 135 mmol/L (ref 135–145)

## 2023-11-09 LAB — CBC
HCT: 36.9 % — ABNORMAL LOW (ref 39.0–52.0)
Hemoglobin: 12.9 g/dL — ABNORMAL LOW (ref 13.0–17.0)
MCH: 31 pg (ref 26.0–34.0)
MCHC: 35 g/dL (ref 30.0–36.0)
MCV: 88.7 fL (ref 80.0–100.0)
Platelets: 155 K/uL (ref 150–400)
RBC: 4.16 MIL/uL — ABNORMAL LOW (ref 4.22–5.81)
RDW: 12.1 % (ref 11.5–15.5)
WBC: 15.6 K/uL — ABNORMAL HIGH (ref 4.0–10.5)
nRBC: 0 % (ref 0.0–0.2)

## 2023-11-09 LAB — MAGNESIUM: Magnesium: 1.7 mg/dL (ref 1.7–2.4)

## 2023-11-09 LAB — GLUCOSE, CAPILLARY
Glucose-Capillary: 138 mg/dL — ABNORMAL HIGH (ref 70–99)
Glucose-Capillary: 162 mg/dL — ABNORMAL HIGH (ref 70–99)
Glucose-Capillary: 151 mg/dL — ABNORMAL HIGH (ref 70–99)
Glucose-Capillary: 149 mg/dL — ABNORMAL HIGH (ref 70–99)

## 2023-11-09 LAB — PHOSPHORUS: Phosphorus: 2.2 mg/dL — ABNORMAL LOW (ref 2.5–4.6)

## 2023-11-09 LAB — HEMOGLOBIN A1C
Hgb A1c MFr Bld: >15.5 % — ABNORMAL HIGH (ref 4.8–5.6)
Mean Plasma Glucose: >398 mg/dL

## 2023-11-09 MED ORDER — SODIUM CHLORIDE 0.9 % IV SOLN: INTRAVENOUS | Status: AC

## 2023-11-09 MED ORDER — MAGNESIUM SULFATE 2 GM/50ML IV SOLN
2.0000 g | Freq: Once | INTRAVENOUS | Status: AC
  Administered 2023-11-09: 2 g via INTRAVENOUS
  Filled 2023-11-09: qty 50

## 2023-11-09 MED ORDER — POTASSIUM PHOSPHATES 15 MMOLE/5ML IV SOLN
30.0000 mmol | Freq: Once | INTRAVENOUS | Status: AC
  Administered 2023-11-09: 30 mmol via INTRAVENOUS
  Filled 2023-11-09: qty 10

## 2023-11-09 MED ORDER — POTASSIUM CHLORIDE CRYS ER 20 MEQ PO TBCR
40.0000 meq | EXTENDED_RELEASE_TABLET | ORAL | Status: AC
  Administered 2023-11-09 (×2): 40 meq via ORAL
  Filled 2023-11-09 (×2): qty 2

## 2023-11-09 NOTE — Progress Notes (Signed)
 PROGRESS NOTE    MATTIS FEATHERLY  FMW:[id number] DOB: [birth year] DOA: 11/07/2023 PCP: Patient, No Pcp Per   Brief Narrative:  This 28 yrs old male with PMH significant for type 1 diabetes, prior DKA, hypertension presented in the ED with complaints of sore throat, polyuria and polydipsia.  He does not reliably give himself insulin  and has not been giving himself insulin   all week.  Patient also has not been drinking / eating due to sore throat.  He also reports occasional drooling.  In the ED he was hyperglycemic with metabolic acidosis, labs concerning for DKA. CT soft tissue of the neck shows supraglottitis with no obvious abscess or drainable collection.  Case was discussed with ENT who recommended Unasyn  and Decadron .  Patient was admitted in the ICU with concern for compromising airway.  Patient is out of DKA and now anion gap is resolved.  Transitioned to subcu insulin .  Assessment & Plan:   Principal Problem:   DKA (diabetic ketoacidosis) (HCC) Active Problems:   Pharyngitis   Odynophagia   Diabetic ketoacidosis: Type 1 diabetes mellitus: - Likely related to noncompliance, supraglottitis. - Continue IV Unasyn . - Will plan to transition to Augmentin  - Discontinue steroids after 3 doses. - Will need diabetic education. - Continue Lantus  30 units daily, sliding scale.   Supraglottitis: - Appreciate ENT follow-up - Will continue antibiotics.   AKI likely due to hypovolemia: - Continue fluid resuscitation. - Renal functions back to normal with IV hydration.   Hyperkalemia:  Resolved. Anion gap metabolic acidosis: - Continue DKA control - Monitor electrolytes   Thrush: Continue oral nystatin .  Hypokalemia / Hypophosphatemia: Replaced.  Continue to monitor  Hypotension: Continue IV fluid resuscitation.  DVT prophylaxis: Heparin . Code Status: Full code Family Communication: Family at bedside Disposition Plan:    Status is: Inpatient Remains inpatient appropriate  because: Admitted for DKA in the setting of supraglottitis,  remains on IV antibiotics DKA resolved.  Continued on subcu insulin .     Consultants:  ENT/PCCM  Procedures: CT Soft tissue neck Antimicrobials:  Anti-infectives (From admission, onward)    Start     Dose/Rate Route Frequency Ordered Stop   11/08/23 1200  amoxicillin -clavulanate (AUGMENTIN ) 875-125 MG per tablet 1 tablet        1 tablet Oral Every 12 hours 11/08/23 1111 11/18/23 0959   11/08/23 0000  Ampicillin -Sulbactam (UNASYN ) 3 g in sodium chloride  0.9 % 100 mL IVPB        3 g 200 mL/hr over 30 Minutes Intravenous  Once 11/07/23 2351 11/08/23 0143   11/07/23 2345  cefTRIAXone (ROCEPHIN) 1 g in sodium chloride  0.9 % 100 mL IVPB  Status:  Discontinued        1 g 200 mL/hr over 30 Minutes Intravenous  Once 11/07/23 2343 11/07/23 2348      Subjective: Patient was seen and examined at bedside. Overnight events noted. Patient blood pressure continued to remain low. Patient  very slow in responding. He reports throat pain has resolved.  Objective: Vitals:   11/08/23 1534 11/08/23 1705 11/09/23 0329 11/09/23 0820  BP:  126/78 112/63 (!) 83/41  Pulse:  78 74 67  Resp:   16 16  Temp: 97.9 F (36.6 C) 98.4 F (36.9 C) 98.3 F (36.8 C) 97.8 F (36.6 C)  TempSrc: Oral Oral Oral Oral  SpO2:  100% 100% 99%  Weight:      Height:        Intake/Output Summary (Last 24 hours) at 11/09/2023 1226 Last  data filed at 11/08/2023 1500 Gross per 24 hour  Intake 144.8 ml  Output --  Net 144.8 ml   Filed Weights   11/07/23 2051  Weight: 99.8 kg    Examination:  General exam: Appears calm and comfortable , not in any acute distress. Respiratory system: Clear to auscultation. Respiratory effort normal.  RR 14 Cardiovascular system: S1 & S2 heard, RRR. No JVD, murmurs, rubs, gallops or clicks.  Gastrointestinal system: Abdomen is non distended, soft and non tender.  Normal bowel sounds heard. Central nervous system: Alert  and oriented x 3. No focal neurological deficits. Extremities: No edema, no cyanosis, no clubbing. Skin: No rashes, lesions or ulcers Psychiatry: Judgement and insight appear normal. Mood & affect appropriate.   Data Reviewed: I have personally reviewed following labs and imaging studies  CBC: Recent Labs  Lab 11/07/23 2059 11/07/23 2232 11/07/23 2233 11/08/23 0432 11/09/23 0557  WBC 19.7*  --   --  18.6* 15.6*  NEUTROABS 16.8*  --   --   --   --   HGB 17.3* 17.0 17.3* 13.7 12.9*  HCT 52.9* 50.0 51.0 40.4 36.9*  MCV 95.7  --   --  91.8 88.7  PLT 225  --   --  181 155   Basic Metabolic Panel: Recent Labs  Lab 11/08/23 0017 11/08/23 0432 11/08/23 1051 11/08/23 1546 11/09/23 0557  NA 137 137 137 139 135  K 4.6 3.9 3.6 3.5 2.7*  CL 105 107 107 106 106  CO2 10* 14* 16* 18* 19*  GLUCOSE 296* 197* 194* 155* 138*  BUN 13 10 8 8 6   CREATININE 1.71* 1.10 0.84 0.75 0.81  CALCIUM  10.0 9.5 9.5 9.4 8.9  MG  --  2.1  --   --  1.7  PHOS  --  <1.0*  --   --  2.2*   GFR: Estimated Creatinine Clearance: 174.4 mL/min (by C-G formula based on SCr of 0.81 mg/dL). Liver Function Tests: Recent Labs  Lab 11/07/23 2059  AST 17  ALT 16  ALKPHOS 107  BILITOT 2.2*  PROT 9.5*  ALBUMIN 4.4   No results for input(s): LIPASE, AMYLASE in the last 168 hours. No results for input(s): AMMONIA in the last 168 hours. Coagulation Profile: No results for input(s): INR, PROTIME in the last 168 hours. Cardiac Enzymes: No results for input(s): CKTOTAL, CKMB, CKMBINDEX, TROPONINI in the last 168 hours. BNP (last 3 results) No results for input(s): PROBNP in the last 8760 hours. HbA1C: No results for input(s): HGBA1C in the last 72 hours. CBG: Recent Labs  Lab 11/08/23 1647 11/08/23 2134 11/09/23 0609 11/09/23 0945 11/09/23 1203  GLUCAP 199* 117* 138* 162* 151*   Lipid Profile: No results for input(s): CHOL, HDL, LDLCALC, TRIG, CHOLHDL, LDLDIRECT in the  last 72 hours. Thyroid  Function Tests: No results for input(s): TSH, T4TOTAL, FREET4, T3FREE, THYROIDAB in the last 72 hours. Anemia Panel: No results for input(s): VITAMINB12, FOLATE, FERRITIN, TIBC, IRON, RETICCTPCT in the last 72 hours. Sepsis Labs: No results for input(s): PROCALCITON, LATICACIDVEN in the last 168 hours.  Recent Results (from the past 240 hours)  Group A Strep by PCR     Status: None   Collection Time: 11/07/23  8:59 PM   Specimen: Throat; Sterile Swab  Result Value Ref Range Status   Group A Strep by PCR NOT DETECTED NOT DETECTED Final    Comment: Performed at [facility], [address] [place], [place] [address]  MRSA Next Gen  by PCR, Nasal     Status: None   Collection Time: 11/08/23  4:32 AM   Specimen: Nasal Mucosa; Nasal Swab  Result Value Ref Range Status   MRSA by PCR Next Gen NOT DETECTED NOT DETECTED Final    Comment: (NOTE) The GeneXpert MRSA Assay (FDA approved for NASAL specimens only), is one component of a comprehensive MRSA colonization surveillance program. It is not intended to diagnose MRSA infection nor to guide or monitor treatment for MRSA infections. Test performance is not FDA approved in patients less than 50 years old. Performed at [facility], [address], [place], [place] [address]     Radiology Studies: CT Soft Tissue Neck W Contrast Result Date: 11/07/2023 CLINICAL DATA:  Initial evaluation for acute soft tissue infection, sore throat. EXAM: CT NECK WITH CONTRAST TECHNIQUE: Multidetector CT imaging of the neck was performed using the standard protocol following the bolus administration of intravenous contrast. RADIATION DOSE REDUCTION: This exam was performed according to the departmental dose-optimization program which includes automated exposure control, adjustment of the mA and/or kV according to patient size and/or use of iterative reconstruction technique. CONTRAST:  75mL OMNIPAQUE   IOHEXOL  350 MG/ML SOLN COMPARISON:  None Available. FINDINGS: Pharynx and larynx: Oral cavity within normal limits. No acute abnormality about the dentition. Palatine tonsils symmetric and within normal limits. Nasopharynx normal. Mild thickening and edematous changes about the epiglottis and aryepiglottic folds, suspicious for mild and/or early acute supraglottitis. Associated small layering retropharyngeal effusion, likely reactive. No loculated collection. Small volume layering secretions noted within the hypopharynx. Remainder of the supraglottic larynx and glottis within normal limits. Subglottic airway clear. Salivary glands: Salivary glands including the parotid and submandibular glands are within normal limits. Thyroid : Normal. Lymph nodes: Mildly prominent upper cervical lymph nodes, largest of which measures 1.2 cm a right level 2, presumably reactive. Vascular: Normal intravascular enhancement seen throughout the neck. Limited intracranial: Unremarkable. Visualized orbits: Unremarkable. Mastoids and visualized paranasal sinuses: Visualized paranasal sinuses are clear. Visualized mastoids and middle ear cavities are well pneumatized and free of fluid. Skeleton: No discrete or worrisome osseous lesions. Upper chest: No other acute finding. Other: None. IMPRESSION: 1. Mild thickening and edematous changes about the epiglottis and aryepiglottic folds, consistent with acute supraglottitis. Associated small layering retropharyngeal effusion, likely reactive. No loculated or drainable fluid collections. 2. Mildly prominent upper cervical lymph nodes, presumably reactive. Electronically Signed   By: Morene Hoard M.D.   On: 11/07/2023 23:31   Scheduled Meds:  amoxicillin -clavulanate  1 tablet Oral Q12H   Chlorhexidine  Gluconate Cloth  6 each Topical Daily   heparin   5,000 Units Subcutaneous Q8H   insulin  aspart  0-15 Units Subcutaneous TID WC   insulin  aspart  0-5 Units Subcutaneous QHS   insulin   aspart  6 Units Subcutaneous TID WC   insulin  glargine  30 Units Subcutaneous Daily   nystatin   5 mL Oral TID AC & HS   potassium chloride   40 mEq Oral Q4H   Continuous Infusions:  sodium chloride      potassium PHOSPHATE  IVPB (in mmol) 30 mmol (11/09/23 0953)     LOS: 1 day    Time spent: 50 mins    Darcel Dawley, MD [facility]   If 7PM-7AM, please contact night-coverage

## 2023-11-09 NOTE — Inpatient Diabetes Management (Signed)
 Inpatient Diabetes Program Recommendations  AACE/ADA: New Consensus Statement on Inpatient Glycemic Control (2015)  Target Ranges:  Prepandial:   less than 140 mg/dL      Peak postprandial:   less than 180 mg/dL (1-2 hours)      Critically ill patients:  140 - 180 mg/dL   Lab Results  Component Value Date   GLUCAP 151 (H) 11/09/2023   HGBA1C >15.5 (H) 11/08/2023    Review of Glycemic Control  Latest Reference Range & Units 11/09/23 06:09 11/09/23 09:45 11/09/23 12:03  Glucose-Capillary 70 - 99 mg/dL 861 (H) 837 (H) 848 (H)   Diabetes history: DM 1 Outpatient Diabetes medications:  Lantus  30 units q HS, Humalog  8 units tid with meals  Current orders for Inpatient glycemic control:  Novolog  0-15 units tid with meals and HS Lantus  30 units daily Novolog  6 units tid with meals  Inpatient Diabetes Program Recommendations:    Note that A1C is markedly high.  Met with patient and his mother again today to discuss DM management.  He has not followed up with Dr. Trixie and states that he only checks blood sugars about once daily.  Discussed complications of Type 1 DM the importance of controlling DM to prevent long term damage and complications.  Again offered CGM and patient states he will wait until he gets insurance from hospital.  I encouraged him to go ahead and get sensor to help with glycemic control.  He states that he only eats once daily, therefore he only takes insulin  once daily.  Attempted to explain that he has Type 1 DM and that he needs to be checking blood sugars at a minimum 4-6 times daily.  He finally agreed to try a sensor at discharge and to f/u with PCP.  Does not seem to fully grasp the importance of controlling DM.  Needs continued education.  Will f/u on 11/10/23.   Thanks,  Randall Bullocks, RN, BC-ADM Inpatient Diabetes Coordinator Pager [phone]  (8a-5p)

## 2023-11-10 ENCOUNTER — Other Ambulatory Visit (HOSPITAL_COMMUNITY): Payer: Self-pay

## 2023-11-10 ENCOUNTER — Telehealth: Payer: Self-pay | Admitting: Internal Medicine

## 2023-11-10 LAB — BASIC METABOLIC PANEL WITH GFR
Anion gap: 11 (ref 5–15)
BUN: 8 mg/dL (ref 6–20)
CO2: 18 mmol/L — ABNORMAL LOW (ref 22–32)
Calcium: 8.5 mg/dL — ABNORMAL LOW (ref 8.9–10.3)
Chloride: 106 mmol/L (ref 98–111)
Creatinine, Ser: 0.85 mg/dL (ref 0.61–1.24)
GFR, Estimated: >60 mL/min (ref >60)
Glucose, Bld: 184 mg/dL — ABNORMAL HIGH (ref 70–99)
Potassium: 3.1 mmol/L — ABNORMAL LOW (ref 3.5–5.1)
Sodium: 135 mmol/L (ref 135–145)

## 2023-11-10 LAB — CBC
HCT: 35.6 % — ABNORMAL LOW (ref 39.0–52.0)
Hemoglobin: 12.6 g/dL — ABNORMAL LOW (ref 13.0–17.0)
MCH: 31.6 pg (ref 26.0–34.0)
MCHC: 35.4 g/dL (ref 30.0–36.0)
MCV: 89.2 fL (ref 80.0–100.0)
Platelets: 163 K/uL (ref 150–400)
RBC: 3.99 MIL/uL — ABNORMAL LOW (ref 4.22–5.81)
RDW: 11.9 % (ref 11.5–15.5)
WBC: 9.4 K/uL (ref 4.0–10.5)
nRBC: 0 % (ref 0.0–0.2)

## 2023-11-10 LAB — GLUCOSE, CAPILLARY
Glucose-Capillary: 158 mg/dL — ABNORMAL HIGH (ref 70–99)
Glucose-Capillary: 169 mg/dL — ABNORMAL HIGH (ref 70–99)
Glucose-Capillary: 178 mg/dL — ABNORMAL HIGH (ref 70–99)

## 2023-11-10 LAB — MAGNESIUM: Magnesium: 1.7 mg/dL (ref 1.7–2.4)

## 2023-11-10 LAB — PHOSPHORUS: Phosphorus: 2.5 mg/dL (ref 2.5–4.6)

## 2023-11-10 MED ORDER — INSULIN GLARGINE 100 UNIT/ML SOLOSTAR PEN
30.0000 [IU] | PEN_INJECTOR | Freq: Every day | SUBCUTANEOUS | 0 refills | Status: AC
  Filled 2023-11-10: qty 9, 30d supply, fill #0

## 2023-11-10 MED ORDER — INSULIN LISPRO (1 UNIT DIAL) 100 UNIT/ML (KWIKPEN)
PEN_INJECTOR | SUBCUTANEOUS | 0 refills | Status: AC
  Filled 2023-11-10: qty 15, 30d supply, fill #0

## 2023-11-10 MED ORDER — AMOXICILLIN-POT CLAVULANATE 875-125 MG PO TABS
1.0000 | ORAL_TABLET | Freq: Two times a day (BID) | ORAL | 0 refills | Status: AC
  Filled 2023-11-10: qty 20, 10d supply, fill #0

## 2023-11-10 MED ORDER — NYSTATIN 100000 UNIT/ML MT SUSP
5.0000 mL | Freq: Three times a day (TID) | OROMUCOSAL | 0 refills | Status: DC
  Filled 2023-11-10: qty 60, 3d supply, fill #0

## 2023-11-10 MED ORDER — AMOXICILLIN-POT CLAVULANATE 875-125 MG PO TABS: 1.0000 | ORAL_TABLET | Freq: Two times a day (BID) | ORAL | 0 refills | Status: DC

## 2023-11-10 MED ORDER — POTASSIUM CHLORIDE 20 MEQ PO PACK
40.0000 meq | PACK | Freq: Once | ORAL | Status: AC
  Administered 2023-11-10: 40 meq via ORAL
  Filled 2023-11-10: qty 2

## 2023-11-10 MED ORDER — NYSTATIN 100000 UNIT/ML MT SUSP: 5.0000 mL | Freq: Three times a day (TID) | OROMUCOSAL | 0 refills | Status: DC

## 2023-11-10 NOTE — Inpatient Diabetes Management (Signed)
 Inpatient Diabetes Program Recommendations  AACE/ADA: New Consensus Statement on Inpatient Glycemic Control (2015)  Target Ranges:  Prepandial:   less than 140 mg/dL      Peak postprandial:   less than 180 mg/dL (1-2 hours)      Critically ill patients:  140 - 180 mg/dL   Lab Results  Component Value Date   GLUCAP 178 (H) 11/10/2023   HGBA1C >15.5 (H) 11/08/2023    Review of Glycemic Control  Latest Reference Range & Units 11/09/23 21:36 11/10/23 06:37 11/10/23 11:18  Glucose-Capillary 70 - 99 mg/dL 841 (H) 830 (H) 821 (H)   Diabetes history: Type 1 DM Outpatient Diabetes medications:  Lantus  30 units daily Humalog  8 units tid with meals Current orders for Inpatient glycemic control:  Novolog  0-15 units tid with meals and HS\ Lantus  30 units daily Novolog  6 unit tid with meals Inpatient Diabetes Program Recommendations:    Note patient to discharge home today.  Of note, patient does not have insurance but will be able to sign up for the next year.   MD ordered application of Freestyle CGM at discharge for patient. Education done regarding application and changing CGM sensor (alternate every 14 days on back of arms), 1 hour warm-up, use of glucometer when alert displays, how to scan CGM for glucose reading and information for PCP. Patient has also been given educational packet regarding use CGM sensor including the 1-800 toll free number for any questions, problems or needs related to the [facility] sensors or reader.    Sensor applied by patient to (L) Arm at (1300).  Explained that glucose readings will not be available until 1 hour after application. Reviewed use of CGM including how to scan, changing Sensor, Vitamin C warning, arrows with glucose readings, and Freestyle app.   Explained to patient that he will need Rx. For this and that it will be $75/month with manufacturers coupon.  Also gave patient Type 1 booklet for him to review.  Gave patient Arleene coupon as well that  will make Basaglar  and Humalog  $35.  Patient verbalized understanding.   Thanks,  Randall Bullocks, RN, BC-ADM Inpatient Diabetes Coordinator Pager [phone]  (8a-5p)

## 2023-11-10 NOTE — TOC Transition Note (Signed)
 Transition of Care [facility]) - Discharge Note   Patient Details  Name: Terry Jensen MRN: [id number] Date of Birth: 03-05-1995  Transition of Care [facility]) CM/SW Contact:  Rosalva Jon Bloch, RN Phone Number: 11/10/2023, 11:59 AM   Clinical Narrative:    Patient will DC to: home  Anticipated DC date: 11/10/2023 Family notified: yes Transport by: car  Per MD patient ready for DC today . RN, patient,  and patient's mom notified of DC.  Pt without PCP, NCM arranged post hospital f/u and to establish primary care with [facility]. At PCP appointment pt will need endocrinologist referral  made by PCP. Pt has used [facility] Endocr[facility]ology / Lela BROCKS. Gherghe, MD in the past however, last visit > 54yrs. Office requesting referral from PCP in order to establish an appointment. Pt without  insurance, limited income . Financial Counseling referral made.  Match Letter provided to assist with med cost.  MATCH MEDICATION ASSISTANCE CARD   Rx BIN: [id number]  Rx Group: [id number] Rx PCN: [id number] Relationship Code: 1 Person Code: [id number]  Patient ID (MRN): [id number]  Patient Name:Terry Jensen   Patient DOB:21-Nov-1995   Discharge Date: 11/10/2023  Expiration Date: (must be filled within 7 days of discharge)11/17/2023  Pt without transportation issues.   RNCM will sign off for now as intervention is no longer needed. Please consult us  again if new needs arise.    Final next level of care: Home/Self Care Barriers to Discharge: No Barriers Identified   Patient Goals and CMS Choice            Discharge Placement                       Discharge Plan and Services Additional resources added to the After Visit Summary for                                       Social Drivers of Health (SDOH) Interventions SDOH Screenings   Food Insecurity: No Food Insecurity (11/08/2023)  Housing: Low Risk  (11/08/2023)  Transportation Needs: No  Transportation Needs (11/08/2023)  Utilities: Not At Risk (11/08/2023)  Depression (PHQ2-9): Low Risk  (04/30/2018)  Tobacco Use: Low Risk  (11/07/2023)     Readmission Risk Interventions     No data to display

## 2023-11-10 NOTE — Progress Notes (Addendum)
 Reviewed AVS, patient expressed understanding of medications, MD follow up reviewed.   Removed IV, Site clean, dry and intact. .  Patient states all belongings brought to the hospital at time of admission are accounted for and packed to take home.  Picked up medications from [facility] pharmacy. Patient states he does not want a wheelchair. States he can walk and he is going to walk down to the cafeteria before he leaves.  Drove self.

## 2023-11-10 NOTE — Telephone Encounter (Signed)
 Case manager called to schedule appt let hre know that patient had not been seen in three years and will need a new referral.

## 2023-11-10 NOTE — Discharge Summary (Signed)
 Physician Discharge Summary  Terry Jensen FMW:[id number] DOB: 09/09/1995 DOA: 11/07/2023  PCP: Patient, No Pcp Per  Admit date: 11/07/2023  Discharge date: 11/10/2023  Admitted From: Home  Disposition:  Home  Recommendations for Outpatient Follow-up:  Follow up with PCP in 1-2 weeks. Please obtain BMP/CBC in one week. Advised to follow-up with endocrinology as scheduled. Advised to take Augmentin  twice a day for 10 days for streptococcal pharyngitis.  Home Health: None Equipment/Devices:None  Discharge Condition: Stable CODE STATUS:Full code Diet recommendation: Carb Modified   Brief Summary/ Hospital Course: This 28 yrs old male with PMH significant for type 1 diabetes, prior DKA, hypertension presented in the ED with complaints of sore throat, polyuria and polydipsia.  He does not reliably give himself insulin  and has not been giving himself insulin  all week.  Patient also has not been drinking / eating due to sore throat.  He also reports occasional drooling.  In the ED he was hyperglycemic with metabolic acidosis, labs concerning for DKA.CT soft tissue of the neck shows supraglottitis with no obvious abscess or drainable collection. Case was discussed with ENT who recommended Unasyn  and Decadron . Patient was admitted in the ICU with concern for compromising airway.  Patient is out of DKA and now anion gap is resolved.  Transitioned to subcu insulin .  Patient was moved out of ICU. Patient has made significant improvement, sore throat has improved.  DKA has resolved.  Patient has been tolerating insulin ,  blood glucose has improved.  Patient wants to be discharged home.  Appointment with PCP and endocrinology scheduled.  Patient was given prescription for long and short acting insulin .  Patient is being discharged home.  Discharge Diagnoses:  Principal Problem:   DKA (diabetic ketoacidosis) (HCC) Active Problems:   Pharyngitis   Odynophagia  Diabetic ketoacidosis: Type 1  diabetes mellitus: - Likely related to noncompliance, supraglottitis. - Continue IV Unasyn . - Antibiotics switched to Augmentin . - Discontinue steroids after 3 doses. - Will need diabetic education. - Continue Lantus  30 units daily, sliding scale.   Supraglottitis: - Appreciate ENT follow-up - Continue Augmentin  for 10 days.   AKI likely due to hypovolemia: - Continue fluid resuscitation. - Renal functions back to normal with IV hydration.   Hyperkalemia:  Resolved. Anion gap metabolic acidosis: - Continue DKA control. - Monitor electrolytes   Thrush: Continue oral nystatin .   Hypokalemia / Hypophosphatemia: Replaced.  Continue to monitor   Hypotension: Continue IV fluid resuscitation. Blood pressure improved.  Discharge Instructions  Discharge Instructions     AMB Referral VBCI Care Management   Complete by: As directed    Diabetes coordinator placed referral- A1C>15% Will place CGM at discharge-  Does not seem to grasp the importance of controlling Dm.  Doubt he is taking insulin  as ordered   Expected date of contact: Emergent - 3 Days   Service: Pharmacy   Pharmacy Service For:  Disease Management Medication Adherence     Disease states to manage: Diabetes   Call MD for:  difficulty breathing, headache or visual disturbances   Complete by: As directed    Call MD for:  persistant dizziness or light-headedness   Complete by: As directed    Call MD for:  persistant nausea and vomiting   Complete by: As directed    Diet - low sodium heart healthy   Complete by: As directed    Diet Carb Modified   Complete by: As directed    Discharge instructions   Complete by: As directed  Advised to follow-up with primary care physician in 1 week. Advised to follow-up with endocrinology as scheduled. Advised to take Augmentin  twice a day for 10 days for streptococcal pharyngitis.   Increase activity slowly   Complete by: As directed       Allergies as of 11/10/2023    No Known Allergies      Medication List     TAKE these medications    amoxicillin -clavulanate 875-125 MG tablet Commonly known as: AUGMENTIN  Take 1 tablet by mouth every 12 (twelve) hours for 10 days.   HumaLOG  KwikPen 100 UNIT/ML KwikPen Generic drug: insulin  lispro Inject 8 units three times a day before each meal. See sliding scale in patient instructions for additional units after each meal   Insupen Pen Needles 32G X 4 MM Misc Generic drug: Insulin  Pen Needle Use 4x a day   Lantus  SoloStar 100 UNIT/ML Solostar Pen Generic drug: insulin  glargine Inject 30 Units into the skin at bedtime.   nystatin  100000 UNIT/ML suspension Commonly known as: MYCOSTATIN  Take 5 mLs (500,000 Units total) by mouth 4 (four) times daily -  before meals and at bedtime.   sildenafil 20 MG tablet Commonly known as: REVATIO Take 20 mg by mouth as needed.        Follow-up Information     [facility] Follow up on 11/17/2023.   Why: Post hospital follow scheduled for 11/17/2023 at 9:40 am with Dr. Cleatus Specking Pt will need Endocrinologist referral made from PCP at appointment Contact information: [id number], [place], [place] [address] Phone: [phone]        [facility] End[facility] / Lela BROCKS. Trixie, MD Follow up.   Why: Referral needed from PCP for endocrinologist appointment Contact information: [facility] [address] [place], [place] [address] [phone]               No Known Allergies  Consultations: PCCM   Procedures/Studies: CT Soft Tissue Neck W Contrast Result Date: 11/07/2023 CLINICAL DATA:  Initial evaluation for acute soft tissue infection, sore throat. EXAM: CT NECK WITH CONTRAST TECHNIQUE: Multidetector CT imaging of the neck was performed using the standard protocol following the bolus administration of intravenous contrast. RADIATION DOSE REDUCTION: This exam  was performed according to the departmental dose-optimization program which includes automated exposure control, adjustment of the mA and/or kV according to patient size and/or use of iterative reconstruction technique. CONTRAST:  75mL OMNIPAQUE  IOHEXOL  350 MG/ML SOLN COMPARISON:  None Available. FINDINGS: Pharynx and larynx: Oral cavity within normal limits. No acute abnormality about the dentition. Palatine tonsils symmetric and within normal limits. Nasopharynx normal. Mild thickening and edematous changes about the epiglottis and aryepiglottic folds, suspicious for mild and/or early acute supraglottitis. Associated small layering retropharyngeal effusion, likely reactive. No loculated collection. Small volume layering secretions noted within the hypopharynx. Remainder of the supraglottic larynx and glottis within normal limits. Subglottic airway clear. Salivary glands: Salivary glands including the parotid and submandibular glands are within normal limits. Thyroid : Normal. Lymph nodes: Mildly prominent upper cervical lymph nodes, largest of which measures 1.2 cm a right level 2, presumably reactive. Vascular: Normal intravascular enhancement seen throughout the neck. Limited intracranial: Unremarkable. Visualized orbits: Unremarkable. Mastoids and visualized paranasal sinuses: Visualized paranasal sinuses are clear. Visualized mastoids and middle ear cavities are well pneumatized and free of fluid. Skeleton: No discrete or worrisome osseous lesions. Upper chest: No other acute finding. Other: None. IMPRESSION: 1. Mild thickening and edematous changes  about the epiglottis and aryepiglottic folds, consistent with acute supraglottitis. Associated small layering retropharyngeal effusion, likely reactive. No loculated or drainable fluid collections. 2. Mildly prominent upper cervical lymph nodes, presumably reactive. Electronically Signed   By: Morene Hoard M.D.   On: 11/07/2023 23:31   Subjective: Patient  was seen and examined at bedside.  Overnight events noted. Patient reports doing much better,  blood sugar has improved. Sore throat has resolved, He wants to be discharged home.  Discharge Exam: Vitals:   11/10/23 0729 11/10/23 1031  BP: (!) 91/58 114/72  Pulse: (!) 56 65  Resp: 16 16  Temp: 97.8 F (36.6 C) 97.9 F (36.6 C)  SpO2: 99% 100%   Vitals:   11/09/23 0900 11/09/23 1832 11/10/23 0729 11/10/23 1031  BP: 106/61 111/68 (!) 91/58 114/72  Pulse:  72 (!) 56 65  Resp:   16 16  Temp:  98.5 F (36.9 C) 97.8 F (36.6 C) 97.9 F (36.6 C)  TempSrc:   Oral Oral  SpO2:  100% 99% 100%  Weight:      Height:        General: Pt is alert, awake, not in acute distress Cardiovascular: RRR, S1/S2 +, no rubs, no gallops Respiratory: CTA bilaterally, no wheezing, no rhonchi Abdominal: Soft, NT, ND, bowel sounds + Extremities: no edema, no cyanosis    The results of significant diagnostics from this hospitalization (including imaging, microbiology, ancillary and laboratory) are listed below for reference.     Microbiology: Recent Results (from the past 240 hours)  Group A Strep by PCR     Status: None   Collection Time: 11/07/23  8:59 PM   Specimen: Throat; Sterile Swab  Result Value Ref Range Status   Group A Strep by PCR NOT DETECTED NOT DETECTED Final    Comment: Performed at [facility], [address], [place], [place] [address]  MRSA Next Gen by PCR, Nasal     Status: None   Collection Time: 11/08/23  4:32 AM   Specimen: Nasal Mucosa; Nasal Swab  Result Value Ref Range Status   MRSA by PCR Next Gen NOT DETECTED NOT DETECTED Final    Comment: (NOTE) The GeneXpert MRSA Assay (FDA approved for NASAL specimens only), is one component of a comprehensive MRSA colonization surveillance program. It is not intended to diagnose MRSA infection nor to guide or monitor treatment for MRSA infections. Test performance is not FDA approved in patients less than 5  years old. Performed at [facility], [address] [place], [place] [address]      Labs: BNP (last 3 results) No results for input(s): BNP in the last 8760 hours. Basic Metabolic Panel: Recent Labs  Lab 11/08/23 0432 11/08/23 1051 11/08/23 1546 11/09/23 0557 11/10/23 0359  NA 137 137 139 135 135  K 3.9 3.6 3.5 2.7* 3.1*  CL 107 107 106 106 106  CO2 14* 16* 18* 19* 18*  GLUCOSE 197* 194* 155* 138* 184*  BUN 10 8 8 6 8   CREATININE 1.10 0.84 0.75 0.81 0.85  CALCIUM  9.5 9.5 9.4 8.9 8.5*  MG 2.1  --   --  1.7 1.7  PHOS <1.0*  --   --  2.2* 2.5   Liver Function Tests: Recent Labs  Lab 11/07/23 2059  AST 17  ALT 16  ALKPHOS 107  BILITOT 2.2*  PROT 9.5*  ALBUMIN 4.4   No results for input(s): LIPASE, AMYLASE in the last 168 hours. No results for  input(s): AMMONIA in the last 168 hours. CBC: Recent Labs  Lab 11/07/23 2059 11/07/23 2232 11/07/23 2233 11/08/23 0432 11/09/23 0557 11/10/23 0359  WBC 19.7*  --   --  18.6* 15.6* 9.4  NEUTROABS 16.8*  --   --   --   --   --   HGB 17.3* 17.0 17.3* 13.7 12.9* 12.6*  HCT 52.9* 50.0 51.0 40.4 36.9* 35.6*  MCV 95.7  --   --  91.8 88.7 89.2  PLT 225  --   --  181 155 163   Cardiac Enzymes: No results for input(s): CKTOTAL, CKMB, CKMBINDEX, TROPONINI in the last 168 hours. BNP: Invalid input(s): POCBNP CBG: Recent Labs  Lab 11/09/23 1203 11/09/23 1659 11/09/23 2136 11/10/23 0637 11/10/23 1118  GLUCAP 151* 149* 158* 169* 178*   D-Dimer No results for input(s): DDIMER in the last 72 hours. Hgb A1c Recent Labs    11/08/23 1051  HGBA1C >15.5*   Lipid Profile No results for input(s): CHOL, HDL, LDLCALC, TRIG, CHOLHDL, LDLDIRECT in the last 72 hours. Thyroid  function studies No results for input(s): TSH, T4TOTAL, T3FREE, THYROIDAB in the last 72 hours.  Invalid input(s): FREET3 Anemia work up No results for input(s): VITAMINB12, FOLATE, FERRITIN,  TIBC, IRON, RETICCTPCT in the last 72 hours. Urinalysis    Component Value Date/Time   COLORURINE STRAW (A) 11/08/2023 0432   APPEARANCEUR CLEAR 11/08/2023 0432   LABSPEC 1.031 (H) 11/08/2023 0432   PHURINE 5.0 11/08/2023 0432   GLUCOSEU >=500 (A) 11/08/2023 0432   HGBUR SMALL (A) 11/08/2023 0432   BILIRUBINUR NEGATIVE 11/08/2023 0432   KETONESUR 80 (A) 11/08/2023 0432   PROTEINUR 100 (A) 11/08/2023 0432   UROBILINOGEN 0.2 12/08/2014 1445   NITRITE NEGATIVE 11/08/2023 0432   LEUKOCYTESUR NEGATIVE 11/08/2023 0432   Sepsis Labs Recent Labs  Lab 11/07/23 2059 11/08/23 0432 11/09/23 0557 11/10/23 0359  WBC 19.7* 18.6* 15.6* 9.4   Microbiology Recent Results (from the past 240 hours)  Group A Strep by PCR     Status: None   Collection Time: 11/07/23  8:59 PM   Specimen: Throat; Sterile Swab  Result Value Ref Range Status   Group A Strep by PCR NOT DETECTED NOT DETECTED Final    Comment: Performed at [facility], [address], [place], [place] [address]  MRSA Next Gen by PCR, Nasal     Status: None   Collection Time: 11/08/23  4:32 AM   Specimen: Nasal Mucosa; Nasal Swab  Result Value Ref Range Status   MRSA by PCR Next Gen NOT DETECTED NOT DETECTED Final    Comment: (NOTE) The GeneXpert MRSA Assay (FDA approved for NASAL specimens only), is one component of a comprehensive MRSA colonization surveillance program. It is not intended to diagnose MRSA infection nor to guide or monitor treatment for MRSA infections. Test performance is not FDA approved in patients less than 63 years old. Performed at [facility], [address], [place], [place] [address]      Time coordinating discharge: Over 30 minutes  SIGNED:   Darcel Dawley, MD  [facility] 11/10/2023, 4:08 PM Pager   If 7PM-7AM, please contact night-coverage

## 2023-11-10 NOTE — Discharge Instructions (Signed)
 Advised to follow-up with primary care physician in 1 week. Advised to follow-up with endocrinology as scheduled. Advised to take Augmentin  twice a day for 10 days for streptococcal pharyngitis.

## 2023-11-13 ENCOUNTER — Telehealth: Payer: Self-pay | Admitting: *Deleted

## 2023-11-13 NOTE — Progress Notes (Unsigned)
 Care Guide Pharmacy Note  11/13/2023 Name: ROSALIO CATTERTON MRN: [id number] DOB: Jun 24, 1995  Referred By: Patient, No Pcp Per Reason for referral: Call Attempt #1 and Complex Care Management (Outreach to schedule referral with pharmacist )   Sybil LELON Eckard is a 28 y.o. year old male who is a primary care patient of Patient, No Pcp Per.  Sybil LELON Alert was referred to the pharmacist for assistance related to: DMII  An unsuccessful telephone outreach was attempted today to contact the patient who was referred to the pharmacy team for assistance with medication management. Additional attempts will be made to contact the patient.  [name], CMA, Care Guide [facility]  [facility], [facility] Guide Direct Dial : [phone]  Fax: [phone] Website: [email or web address]

## 2023-11-14 ENCOUNTER — Ambulatory Visit: Admitting: Internal Medicine

## 2023-11-14 ENCOUNTER — Other Ambulatory Visit (HOSPITAL_COMMUNITY): Payer: Self-pay

## 2023-11-14 NOTE — Progress Notes (Signed)
 Care Guide Pharmacy Note  11/14/2023 Name: Terry Jensen MRN: [id number] DOB: 1995/12/16  Referred By: Patient, No Pcp Per Reason for referral: Call Attempt #1 and Complex Care Management (Outreach to schedule referral with pharmacist )   Terry Jensen is a 28 y.o. year old male who is a primary care patient of Patient, No Pcp Per.  Terry LELON Alert was referred to the pharmacist for assistance related to: DMII  A third unsuccessful telephone outreach was attempted today to contact the patient who was referred to the pharmacy team for assistance with medication management. The Population Health team is pleased to engage with this patient at any time in the future upon receipt of referral and should he/she be interested in assistance from the Population Health team.  [name], CMA [facility]  [facility], [facility] Guide Direct Dial : [phone]  Fax: [phone] Website: [email or web address]

## 2023-11-14 NOTE — Progress Notes (Signed)
 Care Guide Pharmacy Note  11/14/2023 Name: Terry Jensen MRN: [id number] DOB: 1996/01/21  Referred By: Patient, No Pcp Per Reason for referral: Call Attempt #1 and Complex Care Management (Outreach to schedule referral with pharmacist )   Terry Jensen is a 28 y.o. year old male who is a primary care patient of Patient, No Pcp Per.  Terry LELON Alert was referred to the pharmacist for assistance related to: DMII  A second unsuccessful telephone outreach was attempted today to contact the patient who was referred to the pharmacy team for assistance with medication management. Additional attempts will be made to contact the patient.  [name], CMA [place]  [facility], [facility] Guide Direct Dial : [phone]  Fax: [phone] Website: [email or web address]

## 2023-11-17 ENCOUNTER — Ambulatory Visit: Admitting: Student in an Organized Health Care Education/Training Program

## 2023-11-17 ENCOUNTER — Encounter: Payer: Self-pay | Admitting: Student in an Organized Health Care Education/Training Program

## 2023-11-17 VITALS — BP 119/75 | HR 82 | Temp 97.9°F | Resp 16 | Ht 73.0 in | Wt 196.0 lb

## 2023-11-17 DIAGNOSIS — J029 Acute pharyngitis, unspecified: Secondary | ICD-10-CM

## 2023-11-17 DIAGNOSIS — E109 Type 1 diabetes mellitus without complications: Secondary | ICD-10-CM

## 2023-11-17 MED ORDER — FREESTYLE LIBRE 3 SENSOR MISC: 1.0000 | 5 refills | Status: AC

## 2023-11-17 NOTE — Assessment & Plan Note (Signed)
 Chronic and uncontrolled.  First diagnosed in 2020, he has had 4 hospitalizations for DKA, 2 in the last year.  Previously consulted with Dr. Vianne with endocrinology, but has not seen her in about 4 years.  He has low adherence to insulin  regimen, we cleared up some confusion about his doses of long-acting and short acting insulin  today.  Plan is to use Lantus  30 units daily and Humalog  8 units 3 times daily with meals.  He has a libre 3 on right now, I reviewed the trends on his phone, he is high all the time but especially at night.  I think restarting the Lantus  will be helpful.  Follow-up with me in 6 weeks, with more consistent use of the insulin  we can start adjusting the doses and looking for patterns in his glycemic control.  A1c is greater than 15%.  We talked about the risk of future ischemic and neuropathic complications if we do not get better control of his diabetes now.

## 2023-11-17 NOTE — Progress Notes (Signed)
 New Patient Office Visit  Subjective    Patient ID: Terry Jensen, male    DOB: 1996/02/14  Age: 28 y.o. MRN: [id number]  CC:  Chief Complaint  Patient presents with   New Patient (Initial Visit)    Here for establish care    HPI  Discussed the use of AI scribe software for clinical note transcription with the patient, who gave verbal consent to proceed.  History of Present Illness Terry Jensen is a 28 year old male with type 1 diabetes who presents for follow-up after a recent hospitalization for diabetic ketoacidosis.  He was hospitalized from September 23 to November 10, 2023, for diabetic ketoacidosis following a viral infection that presented with a sore throat and poor oral intake. During this hospitalization, he was treated with steroids and antibiotics, which he is completing as prescribed.  He has type 1 diabetes diagnosed approximately 5 years ago and is currently using Lantus , 30 units at bedtime, and Humalog , 8 units three times a day before meals. His blood sugar was 250 mg/dL this morning, and he is using a Libre 3 continuous glucose monitor to track his glucose levels. He reports that he has been taking only 12 units of Lantus  instead of the prescribed 30 units. He typically eats one meal a day due to his work schedule, which affects his insulin  administration.  He lives with his mother and brother and manages his insulin  independently, carrying his insulin  pens to work. 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 He vapes but does not consume alcohol regularly, only on special occasions.  He has been hospitalized twice this year for diabetes-related issues, once in January and again in September. His family history includes diabetes, and he is currently under the care of an endocrinologist, although he has not seen her recently.    Outpatient Encounter Medications as of 11/17/2023  Medication  Sig   amoxicillin -clavulanate (AUGMENTIN ) 875-125 MG tablet Take 1 tablet by mouth every 12 (twelve) hours for 10 days.   Continuous Glucose Sensor (FREESTYLE LIBRE 3 SENSOR) MISC 1 Device by Does not apply route every 14 (fourteen) days. Place 1 sensor on the skin every 14 days. Use to check glucose continuously   insulin  glargine (LANTUS ) 100 UNIT/ML Solostar Pen Inject 30 Units into the skin at bedtime.   insulin  lispro (HUMALOG ) 100 UNIT/ML KwikPen Inject 8 units three times a day before each meal. See sliding scale in patient instructions for additional units after each meal   Insulin  Pen Needle 32G X 4 MM MISC Use 4x a day   sildenafil (REVATIO) 20 MG tablet Take 20 mg by mouth as needed.   [DISCONTINUED] nystatin  (MYCOSTATIN ) 100000 UNIT/ML suspension Take 5 mLs (500,000 Units total) by mouth 4 (four) times daily -  before meals and at bedtime.   No facility-administered encounter medications on file as of 11/17/2023.    Past Medical History:  Diagnosis Date   Diabetes mellitus without complication (HCC)    Hypertension     History reviewed. No pertinent surgical history.  Family History  Problem Relation Age of Onset   Cancer Other    Diabetes Other    Sudden death Neg Hx    Heart attack Neg Hx         Objective    BP 119/75 (BP Location: Left Arm, Patient Position: Sitting, Cuff Size: Normal)   Pulse 82  Temp 97.9 F (36.6 C) (Oral)   Resp 16   Ht 6' 1 (1.854 m)   Wt 196 lb (88.9 kg)   SpO2 100%   BMI 25.86 kg/m   Physical Exam  Gen: Well-appearing man Mouth: Terry Jensen is resolved, posterior oropharynx appears normal with no erythema or effusion Neck: Normal thyroid , no tender adenopathy, no nodules Heart: Regular, no murmur Lungs: Unlabored, clear throughout Ext: Warm, no edema, normal joints Neuro: Alert, conversational, full strength upper and lower extremities, normal gait and balance Psych: Quiet individual, answers questions directly, not anxious or  depressed appearing, a little withdrawn seeming     Assessment & Plan:   Problem List Items Addressed This Visit       High   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  First diagnosed in 2020, he has had 4 hospitalizations for DKA, 2 in the last year.  Previously consulted with Dr. Vianne with endocrinology, but has not seen her in about 4 years.  He has low adherence to insulin  regimen, we cleared up some confusion about his doses of long-acting and short acting insulin  today.  Plan is to use Lantus  30 units daily and Humalog  8 units 3 times daily with meals.  He has a libre 3 on right now, I reviewed the trends on his phone, he is high all the time but especially at night.  I think restarting the Lantus  will be helpful.  Follow-up with me in 6 weeks, with more consistent use of the insulin  we can start adjusting the doses and looking for patterns in his glycemic control.  A1c is greater than 15%.  We talked about the risk of future ischemic and neuropathic complications if we do not get better control of his diabetes now.      Relevant Medications   Continuous Glucose Sensor (FREESTYLE LIBRE 3 SENSOR) MISC     Low   Pharyngitis   Recent severe pharyngitis with symptoms of supraglottitis, but no abscess on CT imaging.  Treated with antibiotics.  Doing very well.  Terry Jensen is resolved and he is going to finish Augmentin  soon.  This problem seems to be resolved.       Return in about 6 weeks (around 12/29/2023) for CGM review.   Cleatus Debby Specking, MD

## 2023-11-17 NOTE — Patient Instructions (Signed)
  VISIT SUMMARY: You had a follow-up appointment today to discuss your type 1 diabetes management after your recent hospitalization for diabetic ketoacidosis. We reviewed your current insulin  regimen, blood sugar levels, and continuous glucose monitoring data.  YOUR PLAN: -TYPE 1 DIABETES MELLITUS WITH RECENT DIABETIC KETOACIDOSIS: Type 1 diabetes is a condition where your body does not produce insulin , requiring you to take insulin  to manage your blood sugar levels. You recently had a serious complication called diabetic ketoacidosis (DKA) due to high blood sugar levels. To prevent this from happening again, you need to increase your Lantus  dose to 30 units at bedtime and continue taking Humalog  8 units three times a day before meals. We also prescribed Libre 3 sensors for continuous glucose monitoring. It's very important not to skip your insulin  doses. We discussed the long-term complications of uncontrolled diabetes and the importance of consistent insulin  use.  INSTRUCTIONS: Please follow up in two months to review your glucose control. Contact our office if you need insulin  refills. Make sure to take your insulin  as prescribed and do not skip doses.

## 2023-11-17 NOTE — Assessment & Plan Note (Signed)
 Recent severe pharyngitis with symptoms of supraglottitis, but no abscess on CT imaging.  Treated with antibiotics.  Doing very well.  Terry Jensen is resolved and he is going to finish Augmentin  soon.  This problem seems to be resolved.

## 2023-12-20 ENCOUNTER — Other Ambulatory Visit: Payer: Self-pay | Admitting: Student in an Organized Health Care Education/Training Program

## 2023-12-20 NOTE — Telephone Encounter (Signed)
 Okay to refill under your name?

## 2023-12-20 NOTE — Telephone Encounter (Unsigned)
 Copied from CRM [id number]. Topic: Clinical - Medication Refill >> Dec 20, 2023  3:27 PM China J wrote: Medication: Insulin  Pen Needle 32G X 4 MM MISC  Has the patient contacted their pharmacy? No (Agent: If no, request that the patient contact the pharmacy for the refill. If patient does not wish to contact the pharmacy document the reason why and proceed with request.) (Agent: If yes, when and what did the pharmacy advise?) The patient was calling for a different issue prior to requesting a refill for his needles.   This is the patient's preferred pharmacy:  [id number] - [place], [place] - [address] AT [facility] OF [address] [place] [address] Phone: [phone] Fax: [phone]  Is this the correct pharmacy for this prescription? Yes If no, delete pharmacy and type the correct one.   Has the prescription been filled recently? No  Is the patient out of the medication? No  Has the patient been seen for an appointment in the last year OR does the patient have an upcoming appointment? Yes  Can we respond through MyChart? Yes  Agent: Please be advised that Rx refills may take up to 3 business days. We ask that you follow-up with your pharmacy.

## 2023-12-21 MED ORDER — INSULIN PEN NEEDLE 32G X 4 MM MISC: 0 refills | Status: AC

## 2023-12-26 ENCOUNTER — Encounter: Payer: Self-pay | Admitting: Student in an Organized Health Care Education/Training Program

## 2023-12-26 ENCOUNTER — Ambulatory Visit: Admitting: Student in an Organized Health Care Education/Training Program

## 2023-12-26 ENCOUNTER — Telehealth: Payer: Self-pay

## 2023-12-26 NOTE — Telephone Encounter (Signed)
 LVM for patient to return call to reschedule no show appt

## 2024-01-03 ENCOUNTER — Other Ambulatory Visit (HOSPITAL_COMMUNITY): Payer: Self-pay

## 2024-02-09 ENCOUNTER — Telehealth: Payer: Self-pay

## 2024-02-09 NOTE — Telephone Encounter (Signed)
 We have not received medical certificate from [employer] to send back?    Copied from CRM #[id number]. Topic: General - Other >> Feb 09, 2024  3:15 PM Nessti S wrote: Reason for CRM: pt called because he needs pcp to send medical certificate to [employer] fax number [phone]

## 2024-02-12 NOTE — Telephone Encounter (Signed)
 Tried to call patient to get more information. Unable to leave vm to return call.

## 2024-02-12 NOTE — Telephone Encounter (Unsigned)
 Copied from CRM [id number]. Topic: General - Other >> Feb 12, 2024  3:54 PM Roselie BROCKS wrote: Reason for CRM: Patient returning call to [facility] concerning needing  medical certification sent to [facility] [place], fax number [phone] . Please return call to patient.

## 2024-02-13 NOTE — Telephone Encounter (Signed)
 If patient calls back please let him know that we have not received anything from [employer]!

## 2024-02-20 NOTE — Telephone Encounter (Signed)
 Patient returned call and I provided him with fax number again so that [employer] can refax form.

## 2024-02-22 ENCOUNTER — Ambulatory Visit: Admitting: Student in an Organized Health Care Education/Training Program
